# Patient Record
Sex: Female | Born: 1935 | Race: White | Hispanic: No | Smoking: Former smoker
Health system: Southern US, Community
[De-identification: ages and names within clinical notes are randomized; demographics above are authoritative.]

## PROBLEM LIST (undated history)

## (undated) DIAGNOSIS — C4492 Squamous cell carcinoma of skin, unspecified: Secondary | ICD-10-CM

## (undated) DIAGNOSIS — G629 Polyneuropathy, unspecified: Secondary | ICD-10-CM

## (undated) DIAGNOSIS — M4800 Spinal stenosis, site unspecified: Secondary | ICD-10-CM

## (undated) DIAGNOSIS — N3281 Overactive bladder: Secondary | ICD-10-CM

## (undated) DIAGNOSIS — M810 Age-related osteoporosis without current pathological fracture: Secondary | ICD-10-CM

## (undated) DIAGNOSIS — E7800 Pure hypercholesterolemia, unspecified: Secondary | ICD-10-CM

## (undated) DIAGNOSIS — I10 Essential (primary) hypertension: Secondary | ICD-10-CM

## (undated) DIAGNOSIS — S329XXA Fracture of unspecified parts of lumbosacral spine and pelvis, initial encounter for closed fracture: Secondary | ICD-10-CM

## (undated) DIAGNOSIS — M1990 Unspecified osteoarthritis, unspecified site: Secondary | ICD-10-CM

## (undated) HISTORY — PX: TONSILLECTOMY: SUR1361

## (undated) HISTORY — DX: Fracture of unspecified parts of lumbosacral spine and pelvis, initial encounter for closed fracture: S32.9XXA

---

## 2001-04-23 ENCOUNTER — Encounter: Payer: Self-pay | Admitting: Internal Medicine

## 2001-04-23 ENCOUNTER — Encounter: Admission: RE | Admit: 2001-04-23 | Discharge: 2001-04-23 | Payer: Self-pay | Admitting: Internal Medicine

## 2002-05-07 ENCOUNTER — Encounter: Admission: RE | Admit: 2002-05-07 | Discharge: 2002-05-07 | Payer: Self-pay | Admitting: Internal Medicine

## 2002-05-07 ENCOUNTER — Encounter: Payer: Self-pay | Admitting: Internal Medicine

## 2003-05-11 ENCOUNTER — Ambulatory Visit (HOSPITAL_COMMUNITY): Admission: RE | Admit: 2003-05-11 | Discharge: 2003-05-11 | Payer: Self-pay | Admitting: Internal Medicine

## 2004-05-11 ENCOUNTER — Ambulatory Visit (HOSPITAL_COMMUNITY): Admission: RE | Admit: 2004-05-11 | Discharge: 2004-05-11 | Payer: Self-pay | Admitting: Internal Medicine

## 2005-05-16 ENCOUNTER — Ambulatory Visit (HOSPITAL_COMMUNITY): Admission: RE | Admit: 2005-05-16 | Discharge: 2005-05-16 | Payer: Self-pay | Admitting: Internal Medicine

## 2006-05-23 ENCOUNTER — Ambulatory Visit (HOSPITAL_COMMUNITY): Admission: RE | Admit: 2006-05-23 | Discharge: 2006-05-23 | Payer: Self-pay | Admitting: Internal Medicine

## 2007-05-26 ENCOUNTER — Ambulatory Visit (HOSPITAL_COMMUNITY): Admission: RE | Admit: 2007-05-26 | Discharge: 2007-05-26 | Payer: Self-pay | Admitting: Internal Medicine

## 2007-07-19 ENCOUNTER — Inpatient Hospital Stay (HOSPITAL_COMMUNITY): Admission: EM | Admit: 2007-07-19 | Discharge: 2007-07-25 | Payer: Self-pay | Admitting: Emergency Medicine

## 2007-09-01 ENCOUNTER — Encounter: Admission: RE | Admit: 2007-09-01 | Discharge: 2007-10-03 | Payer: Self-pay | Admitting: Internal Medicine

## 2008-03-29 ENCOUNTER — Encounter: Admission: RE | Admit: 2008-03-29 | Discharge: 2008-03-29 | Payer: Self-pay | Admitting: Internal Medicine

## 2008-05-27 ENCOUNTER — Ambulatory Visit (HOSPITAL_COMMUNITY): Admission: RE | Admit: 2008-05-27 | Discharge: 2008-05-27 | Payer: Self-pay | Admitting: Internal Medicine

## 2009-05-31 ENCOUNTER — Ambulatory Visit (HOSPITAL_COMMUNITY): Admission: RE | Admit: 2009-05-31 | Discharge: 2009-05-31 | Payer: Self-pay | Admitting: Internal Medicine

## 2010-05-24 ENCOUNTER — Other Ambulatory Visit: Payer: Self-pay | Admitting: Internal Medicine

## 2010-05-30 ENCOUNTER — Ambulatory Visit
Admission: RE | Admit: 2010-05-30 | Discharge: 2010-05-30 | Disposition: A | Discharge status: Home / Self Care | Payer: Medicare Other | Source: Ambulatory Visit | Attending: Internal Medicine | Admitting: Internal Medicine

## 2010-06-20 NOTE — Discharge Summary (Signed)
NAMEKIMORAH, Ashlee [name]              ACCOUNT NO.:  [id number]   MEDICAL RECORD NO.:  [id number]          PATIENT TYPE:  INP   LOCATION:  1508                         FACILITY:  [facility]   PHYSICIAN:  Ashlee Pounds, MD       DATE OF BIRTH:  1914/03/14   DATE OF ADMISSION:  07/19/2007  DATE OF DISCHARGE:  07/24/2007                               DISCHARGE SUMMARY   PRIMARY CARE Ashlee [name]:  Dr. Waynard [name].   DISCHARGE DIAGNOSES:  1. Falls.  2. Deconditioning.  3. Failure to thrive.  4. Iron deficiency anemia.  5. E-coli urinary tract infection.  6. Hypertension.  7. Bronchitis with asthmatic component and hypoxia, slowly resolving.  8. History of tonsil and adenoidectomy.  9. History of osteoarthritis.  10.Known peripheral neuropathy.  11.Osteoporosis.  12.History of shingles to the left face, June 2002.  13.Mild hypertriglyceridemia.  14.History of carpal tunnel syndrome.  15.Severe lumbar spinal stenosis.  16.Hard of hearing.  17.Mild impaired fasting glucose.  18.Aortic regurgitation.  19.Mild renal insufficiency.  20.Peripheral arterial disease with ankle brachial indexes of 0.8 and      mild to moderate plaque in the bilateral carotids.  21.Basal cell skin cancer.   ALLERGIES:  1. PENICILLIN CAUSES SWELLING.  2. NEURONTIN, FATIGUE.  3. FOSAMAX, REFLUX.   MEDICATION LIST:  1. Oxygen 2 liters nasal cannula, slowly wean as she is able based on      room air sats while ambulating.  Incentive spirometry q.1 h while      awake.  2. Flutter valve each shift.  3. Norvasc 5 mg p.o. daily.  4. Tenormin 50 mg p.o. daily, okay to hold if the patient is wheezing.      Hold if systolic blood pressure is less than 110.  5. Aspirin 81 daily.  6. Lidoderm 5% patch on at 8:00 a.m. off at 8:00 p.m. to lower back,      one patch.  7. Iron sulfate 325 mg q.a.m.  8. Levaquin 500 mg through July 26, 2007, 1 tablet per day do not      substitute.  9. Tessalon pearls 200 mg p.o. q.8 h through  June 20.  10.Aranesp as needed.  11.Tylenol 650 mg p.o. t.i.d. p.r.n.  12.Ventolin nebulizer or HFA q.4 h p.r.n. wheezing.  13.Senokot-S 1 tablet q.h.s. p.r.n., hold for diarrhea.  14.Benazepril HCT 28/12.5 mg, one per day.  15.Simvastatin 40 mg one-half daily.  16.Multivitamin one p.o. daily.  17.Vitamin D 400 international units twice a day.  18.TUMS 750 mg p.o. b.i.d.  19.Aldara 5% cream as directed by her dermatologist.  20.Boniva 150 mg monthly.  21.Betasal as directed.   DISCHARGE PROCEDURES:  1. Two units packed red blood cells on July 23, 2007.  2. Aranesp injection on July 22, 2007.  3. PT, OT, case management consultations.  4. Aggressive pulmonary toilet with respiratory therapy.  5. Chest x-ray on July 22, 2007, no active disease.  6. MRI of the T-spine on June 15, showing no significant abnormality      of thoracic spine.  No fractures or other acute abnormalities.  There is a slight cord compression at C6-C7 due to bulging of the      disk and posterior spurring as well as spondylolisthesis of C6-C7.  7. MRI of the lumbar spine done on June 15 shows no acute      abnormalities in the lumbar spine.  The L1 finding that I thought      was a compression fracture was an old compression fracture,      actually was an old Schmorl noted in the superior endplate of L1      that simulates a compression fracture and certainly not acute.  The      severe spinal stenosis at L4-L5 due to severe facet joint      arthropathy and spondylolisthesis and severe facet joint arthritis      at L1 bilaterally.  8. Chest x-ray done June 15 showed no acute finding.  9. Lumbar spine films done on June 13 showed an L1 superior endplate      depression of an indeterminate age.  MRI can be performed as      clinically indicated.  An x-ray done on June 13 showed left basilar      atelectasis or scar, cardiomegaly, and no congestive heart failure      noted.   HISTORY OF PRESENT ILLNESS:   Briefly, [name] is a very  pleasant 77 year old female who lives on her own and has some support  from nieces and nephews.  She presented to medical attention on July 19, 2007 after falling in her bedroom on the way to the bathroom.  There was  no preceding event.  When she was on the floor, she was unable to bring  herself back to a standing admission, feeling her body just did not  work.  She called over the phone, called a neighbor, and 911 was called  and she was brought to the emergency department.   She complained of mild mid to lower back pain that did not radiate down  her leg.  She did report some dysuria and frequency, and she was  recently started on some Vesicare.  In the emergency department, her O2  sats were 91% on room air, blood pressure was fine.  She was afebrile.  Lungs were clear.  She did have a slight murmur and she was moving all  four extremities well.   Her white count was 16.3, hemoglobin 11.2, CK and MB were negative.  Troponin was negative.  Urinalysis showed urinary tract infection.  Labs  were unremarkable.  AST was 55 and glucose was a little high at 148.  Chest x-ray was done which showed left basilar atelectasis and  cardiomegaly without pulmonary edema.  Lumbar x-ray shows L1 superior  end plate depression, needing further studies.  Dr. Eloise [name] from my  office was called, and she was admitted for a fall, failure to thrive,  deconditioning, cystitis and back pain.   HOSPITAL COURSE:  [name] was admitted through the emergency  department after a fall at her house that Dr. Eloise [name] felt was  secondary to hypotension with decreased food and water intake aggravated  by her chronic medical conditions.  He felt that she may have been  orthostatic.  He backed down on her atenolol, took her off her ACE  inhibitor hydrochlorothiazide combination pill, put her on telemetry and  got physical and occupational therapy, at the same time  starting her on  antibiotics.  He also ordered other  films to rule in or rule out a  compression fracture.  She also describes some nausea and upset stomach  and some anorexia.   The following morning, her potassium was 2.6 and this was obviously  repleted.  I started seeing her on July 21, 2007.  I might note the day  that she was admitted two days ago with fall, back pain, urinary tract  infection.  Wonder if she had early pyelo.  Nausea was already better.  There is no other complaints.  She did not have any bowel movements, and  she was having some shortness of breath and wheezing from the night  before, although by the time I had seen her, she had and a nebulizer  treatment and was already better.  Physically, she had a black eye that  had happened beforehand.  She was moving all fours.  She is on oxygen.  Her white count was already improving from 16.3 down to 13.2.  Her  hemoglobin went down into the 9s.  The rest her labs were unremarkable.  The urine culture showed E-coli.  It came back pansensitive.  She was  continued on her quinolone.  Her CK went up to 1209, troponin went up to  0.07, but these were felt related to the fall and muscle injury.  Chest  x-ray again showed no acute disease.   Her nausea was better with the treatment of the urinary tract infection.  Her anorexia and poor appetite was also improving.  Her hypokalemia had  already improved.  Her back pain was already improved.  I added some  Lidoderm, and we did get an MRI.  The MRI did not show any acute  compression fracture, and she does not need any further workup for this.  Medications were adjusted.  For her cough, shortness of breath,  wheezing, chest x-rays being negative, eventually prednisone was added  for a couple of days.  Aggressive pulmonary toilet was done.  Oxygen was  continued because her oxygen sats dropped into the 80s while walking,  and she did develop a cough.  This was felt to be potential  asthmatic  bronchitis, and the Cipro was changed over to Levaquin, dispense as  written, as well as pulmonary toilet.  She appears to be slowly  improving with this.   Falls, failure to thrive, PT, OT, case management were all involved.  She walks with a walker, and she is needing the oxygen.  It was felt  that based on her age and physical conditioning, she would probably be  best served if she went to a skilled nursing facility for rehab for a  couple of weeks prior to returning home.  This was arranged, and on July 24, 2007, she is in stable condition, and she will be discharged there  today.   The elevated CK level was presumed secondary to fall.  There was no  evidence of an MI noted.   For constipation, she had aggressive bowel regimen, and she eventually  did go, and she is doing fine.   Her blood pressure remained fine throughout the hospital stay.  It  has  been as low as 104 and as high as 160.  We will start the ACE HCT  combination back slowly and watch the blood pressures to see if she gets  orthostatic or symptoms.  It is probably smart to keep her on the lowest  doses that she needs.   She is to remain on  DVT prophylaxis.   Her leukocytosis improved and then got worse after couple doses of  steroids, but this is not considered to be anything significant.   She did have some iron deficiency anemia.  She did get a dose of  Aranesp, and then on the next day her hemoglobin actually was lower.  It  was down as low as 8.8.  It was felt that based on the hypoxia and  weakness, she probably would do better if she had some blood.  Two units  of blood were given, and her hemoglobin on the date of discharge was  12.7.  All the rest of her labs were fine.  Her white count is 16.4 and  again, she did get a couple doses of steroids.  She was 91% and 93% on  room air today after she had walked to the bathroom to take care of her  hair.  She will finish the antibiotics in a  couple days for the E-coli  urinary tract infection.  She will go to rehab today.  For the  bronchitis with asthmatic component and mild hypoxia, continue pulmonary  toilet.  May need to come off the beta blocker completely.  Post  transfusion, her anemia is doing much better.   Her short list today for which rehab facility she goes to includes  [facility], includes [facility], and includes [facility].  Her family has a list of other facilities that they are  going to see in the morning and make a decision somewhere around noon  and then she will be okay to go.      Ashlee Pounds, MD  Electronically Signed     JMR/MEDQ  D:  07/24/2007  T:  07/24/2007  Job:  [id number]   cc:   Loraine Leriche A. Perini, M.D.  Fax: [phone]   Barry Dienes. Ashlee [name], M.D.  Fax: [phone]

## 2010-06-20 NOTE — H&P (Signed)
Ashlee [name], Ashlee [name]              ACCOUNT NO.:  [id number]   MEDICAL RECORD NO.:  [id number]          PATIENT TYPE:  EMS   LOCATION:  ED                           FACILITY[facility]   PHYSICIAN:  Barry Dienes. Eloise Harman, M.D.DATE OF BIRTH:  1914/06/01   DATE OF ADMISSION:  07/19/2007  DATE OF DISCHARGE:                              HISTORY & PHYSICAL   CHIEF COMPLAINT:  Status post fall.   HISTORY OF PRESENT ILLNESS:  The patient is a 77 year old Caucasian  woman who is status post a fall this morning.  The fall occurred when  she was going to the bathroom.  She did not have preceding palpitations  or chest discomfort, and did not trip on anything just prior to the  fall.  When she got to the floor, she was unable to bring herself back  to standing position, feeling that her body just did not work.  She  crawled to the phone and called her neighbor who arrived and called 9-1-  1.  Since the fall, she has had mild mid to lower back pain not  radiating down her legs.  She also described a one week of dysuria and  frequency, for which she started Vesicare yesterday.   PAST MEDICAL HISTORY:  1. Hypertension.  2. Osteoarthritis.  3. Decreased hearing bilaterally.   MEDICATIONS PRIOR TO ADMISSION:  1. Benazepril/HCTZ 20/12.5 every a.m.  2. Phenergan 12.5 mg p.o. q.8 h p.r.n. nausea.  3. Amlodipine 5 mg p.o. daily.  4. Ecotrin 81 mg p.o. daily.  5. Atenolol 100 mg p.o. daily.   ALLERGIES:  NO KNOWN DRUG ALLERGIES.   PAST SURGICAL HISTORY:  None.   FAMILY HISTORY:  Notable for a brother who has had diabetes mellitus,  type 2, but no close relatives with premature coronary artery disease,  colon cancer, or breast cancer.   SOCIAL HISTORY:  She has been a widow since 5.  She has no children.  She has no history of tobacco or alcohol abuse.  She does have a person  with a medical power of attorney for her who is a niece who lives in  [place].   REVIEW OF SYSTEMS:  Also notable for  mild low back pain since her fall.  She denies recent fever, chills or anxiety or depression.  She had some  presyncope symptoms recently.   PHYSICAL EXAMINATION:  VITAL SIGNS:  Blood pressure 105/51, pulse 74,  respirations 20, temperature 97.6, pulse oxygen saturation 91% on room  air.  GENERAL:  She is an elderly white female who is in no apparent distress  while lying nearly fully supine.  HEENT:  Exam was within normal limits.  NECK:  Supple without jugular venous distention or carotid bruit.  CHEST:  Clear to auscultation.  HEART:  Regular rate and rhythm.  S1-S2 were present with a systolic  ejection murmur grade 2/6 at the left sternal border.  ABDOMEN:  Normal bowel sounds and no hepatosplenomegaly or tenderness.  EXTREMITIES:  No significant edema.  There were nonpalpable pedal pulses  bilaterally.  Capillary refill occurs in less than 1 second.  NEUROLOGICAL:  She is alert and answered questions well.  At times, she  had mild word-finding difficulty.  She is able to move all four  extremities well.   INITIAL LABORATORY STUDIES:  White blood cells 16.3, hemoglobin 11.2,  hematocrit 32.6, platelets 27, CK-MB 19.1.  Troponin I level was less  than 0.05.  Urinalysis had specific gravity 1.025, was nitrite negative,  11-20 white blood cells, and rare bacteria.  Serum sodium 137, potassium  3.4, chloride 100, carbon dioxide 27, BUN 45, creatinine 1.36, glucose  148, SGOT 55.   Chest x-ray showed the following:  1. Left basilar atelectasis.  2. Cardiomegaly without pulmonary edema.   Lumbar spine x-rays showed the following:  L1 superior end plate  depression of unclear age.   IMPRESSION AND PLAN:  1. Set status post fall:  This is most likely secondary to hypotension      secondary to decreased food and fluid intake that is aggravated by      her chronic medications.  I plan to decrease atenolol to 50 mg once      daily.  We will also administer some IV fluids.  She will  be kept      on telemetry to rule out a significant arrhythmia.  A physical      therapist will evaluate her gait.  2. Cystitis:  Mild symptoms are present which will be treated with      Levaquin 500 mg p.o. daily.  3. Back pain:  Likely secondary to a contusion from a fall versus her      known compression fracture at L1.  I plan to have physical therapy      evaluate her gait.  If she shows significant increased back pain      with standing and weightbearing, then a MRI scan of the thoracic      and lumbar spine will be obtained.           ______________________________  Barry Dienes. Eloise Harman, M.D.     DGP/MEDQ  D:  07/19/2007  T:  07/19/2007  Job:  [id number]   cc:   Loraine Leriche A. Perini, M.D.  Fax: [phone]

## 2010-11-02 LAB — BASIC METABOLIC PANEL
BUN: 44 — ABNORMAL HIGH
BUN: 28 — ABNORMAL HIGH
BUN: 44 — ABNORMAL HIGH
CO2: 22
CO2: 28
Calcium: 7.7 — ABNORMAL LOW
Chloride: 104
Chloride: 108
Chloride: 111
Creatinine, Ser: 1.18
GFR calc Af Amer: >60
GFR calc non Af Amer: >60
Glucose, Bld: 137 — ABNORMAL HIGH
Glucose, Bld: 123 — ABNORMAL HIGH
Glucose, Bld: 128 — ABNORMAL HIGH
Potassium: 3.5
Potassium: 3.6
Potassium: 4
Sodium: 136
Sodium: 140

## 2010-11-02 LAB — URINALYSIS, ROUTINE W REFLEX MICROSCOPIC
Nitrite: NEGATIVE
Specific Gravity, Urine: 1.025
Urobilinogen, UA: 0.2
pH: 6

## 2010-11-02 LAB — CBC
HCT: 26 — ABNORMAL LOW
HCT: 27.8 — ABNORMAL LOW
HCT: 36.9
Hemoglobin: 11.2 — ABNORMAL LOW
Hemoglobin: 8.8 — ABNORMAL LOW
MCHC: 34.3
MCHC: 34.5
MCV: 89
MCV: 87.1
MCV: 88.1
MCV: 88.4
MCV: 89.5
Platelets: 143 — ABNORMAL LOW
Platelets: 188
RBC: 3.18 — ABNORMAL LOW
RBC: 3.67 — ABNORMAL LOW
RBC: 3.35 — ABNORMAL LOW
RBC: 4.23
RDW: 13.5
RDW: 13.7
RDW: 14
RDW: 14.2
WBC: 11.5 — ABNORMAL HIGH
WBC: 13.2 — ABNORMAL HIGH
WBC: 16.4 — ABNORMAL HIGH

## 2010-11-02 LAB — COMPREHENSIVE METABOLIC PANEL
ALT: 29
Albumin: 3.4 — ABNORMAL LOW
Alkaline Phosphatase: 47
BUN: 45 — ABNORMAL HIGH
Chloride: 100
Potassium: 3.4 — ABNORMAL LOW
Sodium: 137
Total Bilirubin: 0.9
Total Protein: 6.8

## 2010-11-02 LAB — DIFFERENTIAL
Basophils Relative: 0
Eosinophils Absolute: 0
Eosinophils Relative: 0
Lymphs Abs: 0.7
Monocytes Absolute: 1.3 — ABNORMAL HIGH
Monocytes Relative: 8

## 2010-11-02 LAB — CROSSMATCH

## 2010-11-02 LAB — POCT CARDIAC MARKERS
CKMB, poc: 14.5
Myoglobin, poc: >500
Myoglobin, poc: >500
Operator id: 294591
Operator id: 294591
Troponin i, poc: <0.05

## 2010-11-02 LAB — VITAMIN B12
Vitamin B-12: 382 (ref 211–911)
Vitamin B-12: 899 (ref 211–911)

## 2010-11-02 LAB — IRON AND TIBC
Iron: 13 — ABNORMAL LOW
Iron: 48
Saturation Ratios: 22
TIBC: 219 — ABNORMAL LOW
UIBC: 171
UIBC: 176

## 2010-11-02 LAB — URINE CULTURE

## 2010-11-02 LAB — URINE MICROSCOPIC-ADD ON

## 2010-11-02 LAB — TROPONIN I: Troponin I: 0.07 — ABNORMAL HIGH

## 2010-11-02 LAB — CK: Total CK: 1209 — ABNORMAL HIGH

## 2010-11-02 LAB — RETICULOCYTES
RBC.: 3.25 — ABNORMAL LOW
RBC.: 3.45 — ABNORMAL LOW
Retic Count, Absolute: 19.5
Retic Ct Pct: 0.6

## 2010-11-02 LAB — ABO/RH: ABO/RH(D): O NEG

## 2010-11-02 LAB — FERRITIN: Ferritin: 706 — ABNORMAL HIGH (ref 10–291)

## 2011-04-29 ENCOUNTER — Encounter (HOSPITAL_COMMUNITY): Payer: Self-pay

## 2011-04-29 ENCOUNTER — Emergency Department (HOSPITAL_COMMUNITY)
Admission: EM | Admit: 2011-04-29 | Discharge: 2011-04-29 | Disposition: A | Discharge status: Home / Self Care | Payer: Medicare Other | Attending: Emergency Medicine | Admitting: Emergency Medicine

## 2011-04-29 ENCOUNTER — Emergency Department (HOSPITAL_COMMUNITY): Payer: Medicare Other

## 2011-04-29 DIAGNOSIS — S0180XA Unspecified open wound of other part of head, initial encounter: Secondary | ICD-10-CM | POA: Insufficient documentation

## 2011-04-29 DIAGNOSIS — Y92009 Unspecified place in unspecified non-institutional (private) residence as the place of occurrence of the external cause: Secondary | ICD-10-CM | POA: Insufficient documentation

## 2011-04-29 DIAGNOSIS — E7800 Pure hypercholesterolemia, unspecified: Secondary | ICD-10-CM | POA: Insufficient documentation

## 2011-04-29 DIAGNOSIS — S0181XA Laceration without foreign body of other part of head, initial encounter: Secondary | ICD-10-CM

## 2011-04-29 DIAGNOSIS — S0093XA Contusion of unspecified part of head, initial encounter: Secondary | ICD-10-CM

## 2011-04-29 DIAGNOSIS — I10 Essential (primary) hypertension: Secondary | ICD-10-CM | POA: Insufficient documentation

## 2011-04-29 DIAGNOSIS — S0083XA Contusion of other part of head, initial encounter: Secondary | ICD-10-CM | POA: Insufficient documentation

## 2011-04-29 DIAGNOSIS — S0003XA Contusion of scalp, initial encounter: Secondary | ICD-10-CM | POA: Insufficient documentation

## 2011-04-29 DIAGNOSIS — W010XXA Fall on same level from slipping, tripping and stumbling without subsequent striking against object, initial encounter: Secondary | ICD-10-CM | POA: Insufficient documentation

## 2011-04-29 HISTORY — DX: Pure hypercholesterolemia, unspecified: E78.00

## 2011-04-29 HISTORY — DX: Essential (primary) hypertension: I10

## 2011-04-29 HISTORY — DX: Spinal stenosis, site unspecified: M48.00

## 2011-04-29 MED ORDER — LIDOCAINE-EPINEPHRINE (PF) 1 %-1:200000 IJ SOLN
INTRAMUSCULAR | Status: AC
  Administered 2011-04-29: 21:00:00
  Filled 2011-04-29: qty 10

## 2011-04-29 NOTE — ED Provider Notes (Signed)
History     CSN: [id number]  Arrival date & time 04/29/11  1757   First MD Initiated Contact with Patient 04/29/11 1817      Chief Complaint  Patient presents with  . Fall    (Consider location/radiation/quality/duration/timing/severity/associated sxs/prior treatment) HPI  Past Medical History  Diagnosis Date  . Hypertension   . High cholesterol   . Spinal stenosis     History reviewed. No pertinent past surgical history.  History reviewed. No pertinent family history.  History  Substance Use Topics  . Smoking status: Never Smoker   . Smokeless tobacco: Not on file  . Alcohol Use: No    OB History    Grav Para Term Preterm Abortions TAB SAB Ect Mult Living                  Review of Systems  Allergies  Penicillins  Home Medications   Current Outpatient Rx  Name Route Sig Dispense Refill  . AMLODIPINE BESYLATE 5 MG PO TABS Oral Take 5 mg by mouth daily.    . ASPIRIN 81 MG PO CHEW Oral Chew 81 mg by mouth daily.    . ATENOLOL 100 MG PO TABS Oral Take 100 mg by mouth daily.    Marland Kitchen BENAZEPRIL-HYDROCHLOROTHIAZIDE 20-12.5 MG PO TABS Oral Take 1 tablet by mouth daily.    Marland Kitchen SIMVASTATIN 40 MG PO TABS Oral Take 20 mg by mouth every evening.      BP 137/74  Pulse 72  Temp(Src) 97.9 F (36.6 C) (Oral)  Resp 16  SpO2 97%  Physical Exam  ED Course  LACERATION REPAIR Date/Time: 04/29/2011 8:34 PM Performed by: Arman Filter Authorized by: Arman Filter Consent: Verbal consent obtained. Consent given by: patient Patient identity confirmed: verbally with patient Body area: head/neck Location details: forehead Laceration length: 1 cm Foreign bodies: unknown Tendon involvement: none Nerve involvement: none Vascular damage: no Anesthesia: local infiltration Local anesthetic: lidocaine 1% without epinephrine Anesthetic total: 1.5 ml Patient sedated: no Irrigation solution: saline Amount of cleaning: standard Debridement: none Degree of undermining:  none Skin closure: 6-0 Prolene Number of sutures: 4 Technique: simple Approximation: close Approximation difficulty: simple Dressing: antibiotic ointment Patient tolerance: Patient tolerated the procedure well with no immediate complications.   (including critical care time)  Labs Reviewed - No data to display Ct Head Wo Contrast  04/29/2011  *RADIOLOGY REPORT*  Clinical Data:  Fall.  CT HEAD WITHOUT CONTRAST CT MAXILLOFACIAL WITHOUT CONTRAST  Technique:  Multidetector CT imaging of the head and maxillofacial structures were performed using the standard protocol without intravenous contrast. Multiplanar CT image reconstructions of the maxillofacial structures were also generated.  Comparison:   None.  CT HEAD  Findings: Left frontal moderate to large sized scalp hematoma without underlying fracture.  Left posterior superior scalp laceration.  No intracranial hemorrhage.  Prominent small vessel disease type changes. No CT evidence of large acute infarct. No intracranial mass lesion detected on this unenhanced exam.  Global atrophy.  Ventricular prominence felt to be related to atrophy rather hydrocephalus.  IMPRESSION: Scalp hematoma/laceration without underlying fracture or intracranial hemorrhage.  Please see above.  CT MAXILLOFACIAL  Findings:   No facial or orbital fracture.  IMPRESSION: No facial or orbital fracture.  Original Report Authenticated By: Fuller Canada, M.D.   Ct Maxillofacial Wo Cm  04/29/2011  *RADIOLOGY REPORT*  Clinical Data:  Fall.  CT HEAD WITHOUT CONTRAST CT MAXILLOFACIAL WITHOUT CONTRAST  Technique:  Multidetector CT imaging of the head  and maxillofacial structures were performed using the standard protocol without intravenous contrast. Multiplanar CT image reconstructions of the maxillofacial structures were also generated.  Comparison:   None.  CT HEAD  Findings: Left frontal moderate to large sized scalp hematoma without underlying fracture.  Left posterior superior  scalp laceration.  No intracranial hemorrhage.  Prominent small vessel disease type changes. No CT evidence of large acute infarct. No intracranial mass lesion detected on this unenhanced exam.  Global atrophy.  Ventricular prominence felt to be related to atrophy rather hydrocephalus.  IMPRESSION: Scalp hematoma/laceration without underlying fracture or intracranial hemorrhage.  Please see above.  CT MAXILLOFACIAL  Findings:   No facial or orbital fracture.  IMPRESSION: No facial or orbital fracture.  Original Report Authenticated By: Fuller Canada, M.D.     1. Head contusion   2. Forehead laceration       MDM  Laceration to forhead        Arman Filter, NP 04/29/11 2035

## 2011-04-29 NOTE — ED Notes (Signed)
[id number]<JY> Expected date:<BR> Expected time: 5:52 PM<BR> Means of arrival:<BR> Comments:<BR> M43 - 89yoF Slipped, fall, hematoma above eye

## 2011-04-29 NOTE — ED Provider Notes (Signed)
Medical screening examination/treatment/procedure(s) were conducted as a shared visit with non-physician practitioner(s) and myself.  I personally evaluated the patient during the encounter  Toy Baker, MD 04/29/11 2328

## 2011-04-29 NOTE — ED Notes (Signed)
Per ems, pt from home, went outdoor and slipped on the brick floor, hematoma on left forehead, bleeding controlled, ems reports laceration above left eyebrow. No LOC. Denied taking coumadin pt DNR

## 2011-04-29 NOTE — Discharge Instructions (Signed)
Have sutures removed in 5 days  Contusion A contusion is a deep bruise. Contusions are the result of an injury that caused bleeding under the skin. The contusion may turn blue, purple, or yellow. Minor injuries will give you a painless contusion, but more severe contusions may stay painful and swollen for a few weeks.  CAUSES  A contusion is usually caused by a blow, trauma, or direct force to an area of the body. SYMPTOMS   Swelling and redness of the injured area.   Bruising of the injured area.   Tenderness and soreness of the injured area.   Pain.  DIAGNOSIS  The diagnosis can be made by taking a history and physical exam. An X-ray, CT scan, or MRI may be needed to determine if there were any associated injuries, such as fractures. TREATMENT  Specific treatment will depend on what area of the body was injured. In general, the best treatment for a contusion is resting, icing, elevating, and applying cold compresses to the injured area. Over-the-counter medicines may also be recommended for pain control. Ask your caregiver what the best treatment is for your contusion. HOME CARE INSTRUCTIONS   Put ice on the injured area.   Put ice in a plastic bag.   Place a towel between your skin and the bag.   Leave the ice on for 15 to 20 minutes, 3 to 4 times a day.   Only take over-the-counter or prescription medicines for pain, discomfort, or fever as directed by your caregiver. Your caregiver may recommend avoiding anti-inflammatory medicines (aspirin, ibuprofen, and naproxen) for 48 hours because these medicines may increase bruising.   Rest the injured area.   If possible, elevate the injured area to reduce swelling.  SEEK IMMEDIATE MEDICAL CARE IF:   You have increased bruising or swelling.   You have pain that is getting worse.   Your swelling or pain is not relieved with medicines.  MAKE SURE YOU:   Understand these instructions.   Will watch your condition.   Will get  help right away if you are not doing well or get worse.  Document Released: 11/01/2004 Document Revised: 01/11/2011 Document Reviewed: 11/27/2010 [facility] Patient Information 2012 [place], [place].Head Injury, Adult You have had a head injury that does not appear serious at this time. A concussion is a state of changed mental ability, usually from a blow to the head. You should take clear liquids for the rest of the day and then resume your regular diet. You should not take sedatives or alcoholic beverages for as long as directed by your caregiver after discharge. After injuries such as yours, most problems occur within the first 24 hours. SYMPTOMS These minor symptoms may be experienced after discharge:  Memory difficulties.   Dizziness.   Headaches.   Double vision.   Hearing difficulties.   Depression.   Tiredness.   Weakness.   Difficulty with concentration.  If you experience any of these problems, you should not be alarmed. A concussion requires a few days for recovery. Many patients with head injuries frequently experience such symptoms. Usually, these problems disappear without medical care. If symptoms last for more than one day, notify your caregiver. See your caregiver sooner if symptoms are becoming worse rather than better. HOME CARE INSTRUCTIONS   During the next 24 hours you must stay with someone who can watch you for the warning signs listed below.  Although it is unlikely that serious side effects will occur, you should be aware of  signs and symptoms which may necessitate your return to this location. Side effects may occur up to 7 - 10 days following the injury. It is important for you to carefully monitor your condition and contact your caregiver or seek immediate medical attention if there is a change in your condition. SEEK IMMEDIATE MEDICAL CARE IF:   There is confusion or drowsiness.   You can not awaken the injured person.   There is nausea (feeling sick to  your stomach) or continued, forceful vomiting.   You notice dizziness or unsteadiness which is getting worse, or inability to walk.   You have convulsions or unconsciousness.   You experience severe, persistent headaches not relieved by over-the-counter or prescription medicines for pain. (Do not take aspirin as this impairs clotting abilities). Take other pain medications only as directed.   You can not use arms or legs normally.   There is clear or bloody discharge from the nose or ears.  MAKE SURE YOU:   Understand these instructions.   Will watch your condition.   Will get help right away if you are not doing well or get worse.  Document Released: 01/22/2005 Document Revised: 01/11/2011 Document Reviewed: 12/10/2008 [employer] Patient Information 2012 [employer], LLLaceration Care, Adult A laceration is a cut or lesion that goes through all layers of the skin and into the tissue just beneath the skin. TREATMENT  Some lacerations may not require closure. Some lacerations may not be able to be closed due to an increased risk of infection. It is important to see your caregiver as soon as possible after an injury to minimize the risk of infection and maximize the opportunity for successful closure. If closure is appropriate, pain medicines may be given, if needed. The wound will be cleaned to help prevent infection. Your caregiver will use stitches (sutures), staples, wound glue (adhesive), or skin adhesive strips to repair the laceration. These tools bring the skin edges together to allow for faster healing and a better cosmetic outcome. However, all wounds will heal with a scar. Once the wound has healed, scarring can be minimized by covering the wound with sunscreen during the day for 1 full year. HOME CARE INSTRUCTIONS  For sutures or staples:  Keep the wound clean and dry.   If you were given a bandage (dressing), you should change it at least once a day. Also, change the dressing if  it becomes wet or dirty, or as directed by your caregiver.   Wash the wound with soap and water 2 times a day. Rinse the wound off with water to remove all soap. Pat the wound dry with a clean towel.   After cleaning, apply a thin layer of the antibiotic ointment as recommended by your caregiver. This will help prevent infection and keep the dressing from sticking.   You may shower as usual after the first 24 hours. Do not soak the wound in water until the sutures are removed.   Only take over-the-counter or prescription medicines for pain, discomfort, or fever as directed by your caregiver.   Get your sutures or staples removed as directed by your caregiver.  For skin adhesive strips:  Keep the wound clean and dry.   Do not get the skin adhesive strips wet. You may bathe carefully, using caution to keep the wound dry.   If the wound gets wet, pat it dry with a clean towel.   Skin adhesive strips will fall off on their own. You may trim the strips as  the wound heals. Do not remove skin adhesive strips that are still stuck to the wound. They will fall off in time.  For wound adhesive:  You may briefly wet your wound in the shower or bath. Do not soak or scrub the wound. Do not swim. Avoid periods of heavy perspiration until the skin adhesive has fallen off on its own. After showering or bathing, gently pat the wound dry with a clean towel.   Do not apply liquid medicine, cream medicine, or ointment medicine to your wound while the skin adhesive is in place. This may loosen the film before your wound is healed.   If a dressing is placed over the wound, be careful not to apply tape directly over the skin adhesive. This may cause the adhesive to be pulled off before the wound is healed.   Avoid prolonged exposure to sunlight or tanning lamps while the skin adhesive is in place. Exposure to ultraviolet light in the first year will darken the scar.   The skin adhesive will usually remain in  place for 5 to 10 days, then naturally fall off the skin. Do not pick at the adhesive film.  You may need a tetanus shot if:  You cannot remember when you had your last tetanus shot.   You have never had a tetanus shot.  If you get a tetanus shot, your arm may swell, get red, and feel warm to the touch. This is common and not a problem. If you need a tetanus shot and you choose not to have one, there is a rare chance of getting tetanus. Sickness from tetanus can be serious. SEEK MEDICAL CARE IF:   You have redness, swelling, or increasing pain in the wound.   You see a red line that goes away from the wound.   You have yellowish-white fluid (pus) coming from the wound.   You have a fever.   You notice a bad smell coming from the wound or dressing.   Your wound breaks open before or after sutures have been removed.   You notice something coming out of the wound such as wood or glass.   Your wound is on your hand or foot and you cannot move a finger or toe.  SEEK IMMEDIATE MEDICAL CARE IF:   Your pain is not controlled with prescribed medicine.   You have severe swelling around the wound causing pain and numbness or a change in color in your arm, hand, leg, or foot.   Your wound splits open and starts bleeding.   You have worsening numbness, weakness, or loss of function of any joint around or beyond the wound.   You develop painful lumps near the wound or on the skin anywhere on your body.  MAKE SURE YOU:   Understand these instructions.   Will watch your condition.   Will get help right away if you are not doing well or get worse.  Document Released: 01/22/2005 Document Revised: 01/11/2011 Document Reviewed: 07/18/2010 [facility] [place] Patient Information 2012 [place], [place].C.

## 2011-04-29 NOTE — ED Notes (Signed)
Suture cart at pt bedside. ED NP Manus Rudd notified.

## 2011-04-29 NOTE — ED Provider Notes (Addendum)
History     CSN: [id number]  Arrival date & time 04/29/11  1757   First MD Initiated Contact with Patient 04/29/11 1817      Chief Complaint  Patient presents with  . Fall    (Consider location/radiation/quality/duration/timing/severity/associated sxs/prior treatment) The history is provided by the patient.   Patient here after slipping and falling at home while using her cane. Patient fell face forward striking her left forehead. No loss of consciousness. Denies any confusion or headache from this. No blurred vision. Denies any hip pain chest pain or abdominal pain. No recent illnesses. Past Medical History  Diagnosis Date  . Hypertension   . High cholesterol   . Spinal stenosis     No past surgical history on file.  No family history on file.  History  Substance Use Topics  . Smoking status: Never Smoker   . Smokeless tobacco: Not on file  . Alcohol Use: No    OB History    Grav Para Term Preterm Abortions TAB SAB Ect Mult Living                  Review of Systems  All other systems reviewed and are negative.    Allergies  Penicillins  Home Medications  No current outpatient prescriptions on file.  There were no vitals taken for this visit.  Physical Exam  Nursing note and vitals reviewed. Constitutional: She is oriented to person, place, and time. She appears well-developed and well-nourished.  Non-toxic appearance. No distress.  HENT:  Head: Normocephalic. Head is with abrasion, with contusion and with laceration.         Large for hematoma noted  Eyes: Conjunctivae, EOM and lids are normal. Pupils are equal, round, and reactive to light.  Neck: Normal range of motion. Neck supple. No tracheal deviation present. No mass present.  Cardiovascular: Normal rate, regular rhythm and normal heart sounds.  Exam reveals no gallop.   No murmur heard. Pulmonary/Chest: Effort normal and breath sounds normal. No stridor. No respiratory distress. She has no  decreased breath sounds. She has no wheezes. She has no rhonchi. She has no rales.  Abdominal: Soft. Normal appearance and bowel sounds are normal. She exhibits no distension. There is no tenderness. There is no rebound and no CVA tenderness.  Musculoskeletal: Normal range of motion. She exhibits no edema and no tenderness.  Neurological: She is alert and oriented to person, place, and time. She has normal strength. No cranial nerve deficit or sensory deficit. GCS eye subscore is 4. GCS verbal subscore is 5. GCS motor subscore is 6.  Skin: Skin is warm and dry. No abrasion and no rash noted.  Psychiatric: She has a normal mood and affect. Her speech is normal and behavior is normal.    ED Course  Procedures (including critical care time)  Labs Reviewed - No data to display No results found.   No diagnosis found.    MDM  Head CT negative.  neurological exam stable. Patient stable for discharge        Toy Baker, MD 04/29/11 1948  Toy Baker, MD 04/29/11 2020

## 2012-10-02 ENCOUNTER — Emergency Department (HOSPITAL_COMMUNITY): Payer: Medicare Other

## 2012-10-02 ENCOUNTER — Observation Stay (HOSPITAL_COMMUNITY)
Admission: EM | Admit: 2012-10-02 | Discharge: 2012-10-03 | Disposition: A | Discharge status: Skilled Nursing Facility | Payer: Medicare Other | Attending: Internal Medicine | Admitting: Internal Medicine

## 2012-10-02 ENCOUNTER — Encounter (HOSPITAL_COMMUNITY): Payer: Self-pay | Admitting: *Deleted

## 2012-10-02 DIAGNOSIS — I10 Essential (primary) hypertension: Secondary | ICD-10-CM | POA: Diagnosis present

## 2012-10-02 DIAGNOSIS — W108XXA Fall (on) (from) other stairs and steps, initial encounter: Secondary | ICD-10-CM | POA: Insufficient documentation

## 2012-10-02 DIAGNOSIS — M4800 Spinal stenosis, site unspecified: Secondary | ICD-10-CM

## 2012-10-02 DIAGNOSIS — W19XXXA Unspecified fall, initial encounter: Secondary | ICD-10-CM

## 2012-10-02 DIAGNOSIS — R911 Solitary pulmonary nodule: Secondary | ICD-10-CM

## 2012-10-02 DIAGNOSIS — S32509A Unspecified fracture of unspecified pubis, initial encounter for closed fracture: Secondary | ICD-10-CM | POA: Insufficient documentation

## 2012-10-02 DIAGNOSIS — G629 Polyneuropathy, unspecified: Secondary | ICD-10-CM

## 2012-10-02 DIAGNOSIS — N318 Other neuromuscular dysfunction of bladder: Secondary | ICD-10-CM | POA: Insufficient documentation

## 2012-10-02 DIAGNOSIS — D72829 Elevated white blood cell count, unspecified: Secondary | ICD-10-CM

## 2012-10-02 DIAGNOSIS — R350 Frequency of micturition: Secondary | ICD-10-CM

## 2012-10-02 DIAGNOSIS — E876 Hypokalemia: Secondary | ICD-10-CM | POA: Diagnosis present

## 2012-10-02 DIAGNOSIS — G589 Mononeuropathy, unspecified: Secondary | ICD-10-CM | POA: Insufficient documentation

## 2012-10-02 DIAGNOSIS — S32591A Other specified fracture of right pubis, initial encounter for closed fracture: Secondary | ICD-10-CM

## 2012-10-02 DIAGNOSIS — S0101XA Laceration without foreign body of scalp, initial encounter: Secondary | ICD-10-CM

## 2012-10-02 DIAGNOSIS — M25559 Pain in unspecified hip: Principal | ICD-10-CM | POA: Insufficient documentation

## 2012-10-02 DIAGNOSIS — E785 Hyperlipidemia, unspecified: Secondary | ICD-10-CM | POA: Diagnosis present

## 2012-10-02 DIAGNOSIS — S3210XA Unspecified fracture of sacrum, initial encounter for closed fracture: Secondary | ICD-10-CM

## 2012-10-02 DIAGNOSIS — S0100XA Unspecified open wound of scalp, initial encounter: Secondary | ICD-10-CM | POA: Insufficient documentation

## 2012-10-02 DIAGNOSIS — M810 Age-related osteoporosis without current pathological fracture: Secondary | ICD-10-CM

## 2012-10-02 DIAGNOSIS — Y92009 Unspecified place in unspecified non-institutional (private) residence as the place of occurrence of the external cause: Secondary | ICD-10-CM

## 2012-10-02 HISTORY — DX: Age-related osteoporosis without current pathological fracture: M81.0

## 2012-10-02 HISTORY — DX: Overactive bladder: N32.81

## 2012-10-02 HISTORY — DX: Polyneuropathy, unspecified: G62.9

## 2012-10-02 HISTORY — DX: Squamous cell carcinoma of skin, unspecified: C44.92

## 2012-10-02 LAB — URINALYSIS, ROUTINE W REFLEX MICROSCOPIC
Bilirubin Urine: NEGATIVE
Glucose, UA: NEGATIVE mg/dL
Hgb urine dipstick: NEGATIVE
Ketones, ur: NEGATIVE mg/dL
Leukocytes, UA: NEGATIVE
Nitrite: NEGATIVE
Protein, ur: NEGATIVE mg/dL
Specific Gravity, Urine: 1.022 (ref 1.005–1.030)
Urobilinogen, UA: 0.2 mg/dL (ref 0.0–1.0)
pH: 7.5 (ref 5.0–8.0)

## 2012-10-02 LAB — CBC WITH DIFFERENTIAL/PLATELET
Basophils Absolute: 0 10*3/uL (ref 0.0–0.1)
Basophils Relative: 0 % (ref 0–1)
Eosinophils Absolute: 0.1 10*3/uL (ref 0.0–0.7)
Eosinophils Relative: 0 % (ref 0–5)
HCT: 29 % — ABNORMAL LOW (ref 36.0–46.0)
Hemoglobin: 9.8 g/dL — ABNORMAL LOW (ref 12.0–15.0)
Lymphocytes Relative: 9 % — ABNORMAL LOW (ref 12–46)
Lymphs Abs: 1.4 10*3/uL (ref 0.7–4.0)
MCH: 30.5 pg (ref 26.0–34.0)
MCHC: 33.8 g/dL (ref 30.0–36.0)
MCV: 90.3 fL (ref 78.0–100.0)
Monocytes Absolute: 0.9 10*3/uL (ref 0.1–1.0)
Monocytes Relative: 6 % (ref 3–12)
Neutro Abs: 13 10*3/uL — ABNORMAL HIGH (ref 1.7–7.7)
Neutrophils Relative %: 84 % — ABNORMAL HIGH (ref 43–77)
Platelets: 172 10*3/uL (ref 150–400)
RBC: 3.21 MIL/uL — ABNORMAL LOW (ref 3.87–5.11)
RDW: 13.7 % (ref 11.5–15.5)
WBC: 15.5 10*3/uL — ABNORMAL HIGH (ref 4.0–10.5)

## 2012-10-02 LAB — BASIC METABOLIC PANEL
BUN: 34 mg/dL — ABNORMAL HIGH (ref 6–23)
CO2: 24 mEq/L (ref 19–32)
Calcium: 9.2 mg/dL (ref 8.4–10.5)
Chloride: 99 mEq/L (ref 96–112)
Creatinine, Ser: 0.93 mg/dL (ref 0.50–1.10)
GFR calc Af Amer: 58 mL/min — ABNORMAL LOW (ref >90)
GFR calc non Af Amer: 50 mL/min — ABNORMAL LOW (ref >90)
Glucose, Bld: 112 mg/dL — ABNORMAL HIGH (ref 70–99)
Potassium: 3.3 mEq/L — ABNORMAL LOW (ref 3.5–5.1)
Sodium: 134 mEq/L — ABNORMAL LOW (ref 135–145)

## 2012-10-02 MED ORDER — SODIUM CHLORIDE 0.9 % IJ SOLN: 3.0000 mL | INTRAMUSCULAR | Status: DC | PRN

## 2012-10-02 MED ORDER — MORPHINE SULFATE 2 MG/ML IJ SOLN: 2.0000 mg | INTRAMUSCULAR | Status: DC | PRN

## 2012-10-02 MED ORDER — ACETAMINOPHEN 650 MG RE SUPP: 650.0000 mg | Freq: Four times a day (QID) | RECTAL | Status: DC | PRN

## 2012-10-02 MED ORDER — TETANUS-DIPHTH-ACELL PERTUSSIS 5-2.5-18.5 LF-MCG/0.5 IM SUSP
0.5000 mL | Freq: Once | INTRAMUSCULAR | Status: AC
  Administered 2012-10-02: 0.5 mL via INTRAMUSCULAR
  Filled 2012-10-02: qty 0.5

## 2012-10-02 MED ORDER — HYDROCHLOROTHIAZIDE 12.5 MG PO CAPS
12.5000 mg | ORAL_CAPSULE | Freq: Every day | ORAL | Status: DC
  Administered 2012-10-03: 12.5 mg via ORAL
  Filled 2012-10-02 (×2): qty 1

## 2012-10-02 MED ORDER — ENOXAPARIN SODIUM 30 MG/0.3ML ~~LOC~~ SOLN
30.0000 mg | Freq: Every day | SUBCUTANEOUS | Status: DC
  Administered 2012-10-02 – 2012-10-03 (×2): 30 mg via SUBCUTANEOUS
  Filled 2012-10-02 (×2): qty 0.3

## 2012-10-02 MED ORDER — ACETAMINOPHEN 325 MG PO TABS
650.0000 mg | ORAL_TABLET | Freq: Four times a day (QID) | ORAL | Status: DC | PRN
  Administered 2012-10-03: 650 mg via ORAL
  Filled 2012-10-02: qty 2

## 2012-10-02 MED ORDER — SODIUM CHLORIDE 0.9 % IV SOLN: 250.0000 mL | INTRAVENOUS | Status: DC | PRN

## 2012-10-02 MED ORDER — HYDROCODONE-ACETAMINOPHEN 5-325 MG PO TABS
1.0000 | ORAL_TABLET | ORAL | Status: DC | PRN
  Administered 2012-10-02: 1 via ORAL
  Filled 2012-10-02: qty 1

## 2012-10-02 MED ORDER — OXYCODONE-ACETAMINOPHEN 5-325 MG PO TABS
1.0000 | ORAL_TABLET | Freq: Once | ORAL | Status: AC
  Administered 2012-10-02: 1 via ORAL
  Filled 2012-10-02: qty 1

## 2012-10-02 MED ORDER — SODIUM CHLORIDE 0.9 % IJ SOLN
3.0000 mL | Freq: Two times a day (BID) | INTRAMUSCULAR | Status: DC
  Administered 2012-10-02: 3 mL via INTRAVENOUS

## 2012-10-02 MED ORDER — ALUM & MAG HYDROXIDE-SIMETH 200-200-20 MG/5ML PO SUSP: 30.0000 mL | Freq: Four times a day (QID) | ORAL | Status: DC | PRN

## 2012-10-02 MED ORDER — BENAZEPRIL HCL 20 MG PO TABS
20.0000 mg | ORAL_TABLET | Freq: Every day | ORAL | Status: DC
  Administered 2012-10-03: 20 mg via ORAL
  Filled 2012-10-02 (×2): qty 1

## 2012-10-02 MED ORDER — ONDANSETRON HCL 4 MG/2ML IJ SOLN: 4.0000 mg | Freq: Four times a day (QID) | INTRAMUSCULAR | Status: DC | PRN

## 2012-10-02 MED ORDER — SIMVASTATIN 20 MG PO TABS
20.0000 mg | ORAL_TABLET | Freq: Every evening | ORAL | Status: DC
  Administered 2012-10-02: 20 mg via ORAL
  Filled 2012-10-02 (×2): qty 1

## 2012-10-02 MED ORDER — ATENOLOL 100 MG PO TABS
100.0000 mg | ORAL_TABLET | Freq: Every morning | ORAL | Status: DC
  Administered 2012-10-03: 100 mg via ORAL
  Filled 2012-10-02 (×2): qty 1

## 2012-10-02 MED ORDER — ASPIRIN 81 MG PO CHEW
81.0000 mg | CHEWABLE_TABLET | Freq: Every morning | ORAL | Status: DC
  Administered 2012-10-03: 81 mg via ORAL
  Filled 2012-10-02 (×2): qty 1

## 2012-10-02 MED ORDER — AMLODIPINE BESYLATE 5 MG PO TABS
5.0000 mg | ORAL_TABLET | Freq: Every day | ORAL | Status: DC
  Administered 2012-10-03: 5 mg via ORAL
  Filled 2012-10-02 (×2): qty 1

## 2012-10-02 MED ORDER — POLYETHYLENE GLYCOL 3350 17 G PO PACK: 17.0000 g | PACK | Freq: Every day | ORAL | Status: DC | PRN

## 2012-10-02 MED ORDER — POTASSIUM CHLORIDE CRYS ER 20 MEQ PO TBCR
20.0000 meq | EXTENDED_RELEASE_TABLET | Freq: Every day | ORAL | Status: DC
  Administered 2012-10-02 – 2012-10-03 (×2): 20 meq via ORAL
  Filled 2012-10-02 (×3): qty 1

## 2012-10-02 MED ORDER — BENAZEPRIL-HYDROCHLOROTHIAZIDE 20-12.5 MG PO TABS: 1.0000 | ORAL_TABLET | Freq: Every day | ORAL | Status: DC

## 2012-10-02 MED ORDER — ONDANSETRON HCL 4 MG PO TABS: 4.0000 mg | ORAL_TABLET | Freq: Four times a day (QID) | ORAL | Status: DC | PRN

## 2012-10-02 MED ORDER — KETOROLAC TROMETHAMINE 30 MG/ML IJ SOLN: 15.0000 mg | Freq: Four times a day (QID) | INTRAMUSCULAR | Status: DC | PRN

## 2012-10-02 NOTE — ED Provider Notes (Signed)
CSN: [id number]     Arrival date & time 10/02/12  0912 History   First MD Initiated Contact with Patient 10/02/12 0913     Chief Complaint  Patient presents with  . Fall  . Head Laceration   (Consider location/radiation/quality/duration/timing/severity/associated sxs/prior Treatment) HPI  89yF with lower back/groin pain after fall. Fells coming down steps at home. Thinks she missed a step. Did hit head. No loc. No blood thinners aside from asa. Mild Ha. No neck pain. No acute visual complaints, numbness, tingling or loss of strength. Has not tried ambulating since fall. Baseline mental status per family at bedside.   Past Medical History  Diagnosis Date  . Hypertension   . High cholesterol   . Spinal stenosis    History reviewed. No pertinent past surgical history. No family history on file. History  Substance Use Topics  . Smoking status: Never Smoker   . Smokeless tobacco: Not on file  . Alcohol Use: No   OB History   Grav Para Term Preterm Abortions TAB SAB Ect Mult Living                 Review of Systems  All systems reviewed and negative, other than as noted in HPI.   Allergies  Penicillins  Home Medications   Current Outpatient Rx  Name  Route  Sig  Dispense  Refill  . amLODipine (NORVASC) 5 MG tablet   Oral   Take 5 mg by mouth daily.         Marland Kitchen aspirin 81 MG chewable tablet   Oral   Chew 81 mg by mouth every morning.          Marland Kitchen atenolol (TENORMIN) 100 MG tablet   Oral   Take 100 mg by mouth every morning.          . benazepril-hydrochlorthiazide (LOTENSIN HCT) 20-12.5 MG per tablet   Oral   Take 1 tablet by mouth daily.         Marland Kitchen ibandronate (BONIVA) 150 MG tablet   Oral   Take 150 mg by mouth every 30 (thirty) days. Take in the morning with a full glass of water, on an empty stomach, and do not take anything else by mouth or lie down for the next 30 min.         . simvastatin (ZOCOR) 40 MG tablet   Oral   Take 20 mg by mouth every  evening.          BP 137/63  Pulse 64  Temp(Src) 98.5 F (36.9 C) (Oral)  Resp 14  SpO2 92% Physical Exam  Nursing note and vitals reviewed. Constitutional: She appears well-developed and well-nourished. No distress.  HENT:  Head: Normocephalic and atraumatic.  R scalp laceration 2cm  Eyes: Conjunctivae are normal. Right eye exhibits no discharge. Left eye exhibits no discharge.  Neck: Neck supple.  Cardiovascular: Normal rate, regular rhythm and normal heart sounds.  Exam reveals no gallop and no friction rub.   No murmur heard. Pulmonary/Chest: Effort normal and breath sounds normal. No respiratory distress.  Abdominal: Soft. She exhibits no distension. There is no tenderness.  Musculoskeletal: She exhibits no edema and no tenderness.  B/l LE not shortened or malrotated. Able to range b/l hips fairly comfortably although reports increased back pain with strength leg raise. No midline spinal tenderness.  Neurological: She is alert.  Skin: Skin is warm and dry.  Psychiatric: She has a normal mood and affect. Her behavior is normal.  Thought content normal.    ED Course  Procedures (including critical care time)  LACERATION REPAIR Performed by: Raeford Razor Authorized by: Raeford Razor Consent: Verbal consent obtained. Risks and benefits: risks, benefits and alternatives were discussed Consent given by: patient Patient identity confirmed: provided demographic data Prepped and Draped in normal sterile fashion Wound explored  Laceration Location: R scalp  Laceration Length: 2 cm  No Foreign Bodies seen or palpated  Anesthesia: local infiltration  Local anesthetic:none Anesthetic total: n/a Irrigation method: syringe Amount of cleaning: standard  Skin closure: single layer  Number of sutures: 4  Technique:staple Patient tolerance: Patient tolerated the procedure well with no immediate complications.  Labs Review Labs Reviewed  CBC WITH DIFFERENTIAL -  Abnormal; Notable for the following:    WBC 15.5 (*)    RBC 3.21 (*)    Hemoglobin 9.8 (*)    HCT 29.0 (*)    Neutrophils Relative % 84 (*)    Neutro Abs 13.0 (*)    Lymphocytes Relative 9 (*)    All other components within normal limits  BASIC METABOLIC PANEL - Abnormal; Notable for the following:    Sodium 134 (*)    Potassium 3.3 (*)    Glucose, Bld 112 (*)    BUN 34 (*)    GFR calc non Af Amer 50 (*)    GFR calc Af Amer 58 (*)    All other components within normal limits  URINALYSIS, ROUTINE W REFLEX MICROSCOPIC   Imaging Review Dg Pelvis 1-2 Views  10/02/2012   *RADIOLOGY REPORT*  Clinical Data: History of fall.  PELVIS - 1-2 VIEW  Comparison: No priors.  Findings: A single AP view of the pelvis demonstrates no acute displaced fracture of the bony pelvic ring.  There is mild irregularity of the right inferior pubic ramus, which may represent an old healed fracture.  Bilateral proximal femurs as visualized appear intact, the femoral heads project over the acetabula bilaterally.  A large calcification in the right hemipelvis likely represents a calcified fibroid.  IMPRESSION: 1.  No definite acute radiographic abnormality of the bony pelvis.   Original Report Authenticated By: Trudie Reed, M.D.   Dg Sacrum/coccyx  10/02/2012   *RADIOLOGY REPORT*  Clinical Data: History of fall.  Buttock pain.  Hip pain.  SACRUM AND COCCYX - 2+ VIEW  Comparison: Pelvis radiograph 10/02/2012.  Findings: Three views of the sacrum and coccyx are limited by overlying bowel contents on the frontal projections.  The lateral projection demonstrates no definite acute displaced fracture of the sacrum or coccyx.  Visualized portions of the bony pelvis otherwise appear intact, although there is mild irregularity of the inferior pubic ramus on the right, which may represent an old healed fracture.  Large coarse calcification in the right hemipelvis is most compatible with a calcified fibroid.  IMPRESSION: 1.   Slightly limited examination demonstrating no definite acute displaced fracture of the sacrum or coccyx.   Original Report Authenticated By: Trudie Reed, M.D.   Ct Head Wo Contrast  10/02/2012   *RADIOLOGY REPORT*  Clinical Data:  History of fall.  Laceration to the head.  CT HEAD WITHOUT CONTRAST CT CERVICAL SPINE WITHOUT CONTRAST  Technique:  Multidetector CT imaging of the head and cervical spine was performed following the standard protocol without intravenous contrast.  Multiplanar CT image reconstructions of the cervical spine were also generated.  Comparison:  Head CT 04/29/2011.  CT HEAD  Findings: There is some soft tissue swelling in the right occipital  scalp.  Moderate cerebral and cerebellar atrophy.  Extensive patchy and confluent areas of decreased attenuation throughout the deep and periventricular white matter of the cerebral hemispheres bilaterally, compatible with chronic microvascular ischemic disease. No acute displaced skull fractures are identified.  No acute intracranial abnormality.  Specifically, no evidence of acute post-traumatic intracranial hemorrhage, no definite regions of acute/subacute cerebral ischemia, no focal mass, mass effect, hydrocephalus or abnormal intra or extra-axial fluid collections. Visualized paranasal sinuses and mastoids are well pneumatized, with exception of a small low attenuation air fluid level in the right maxillary sinus.  IMPRESSION: 1.  Right occipital scalp hematoma/contusion, without underlying displaced skull fracture or signs of significant acute intracranial trauma. 2.  Moderate cerebral and cerebellar atrophy with extensive chronic microvascular ischemic changes throughout the cerebral white matter.  CT CERVICAL SPINE  Findings: No acute displaced fractures of the cervical spine. Prevertebral soft tissues are normal.  Multilevel degenerative disc disease, most severe at C4-C5, C5-C6 and C6-C7.  Multilevel facet arthropathy is also noted.  There  is 4 mm of anterolisthesis of C6 upon C7, favored to be chronic.  Alignment is otherwise anatomic. Visualized portions of the upper thorax are remarkable for bilateral apical pleuroparenchymal thickening.  In addition, there is a 7 mm nodular-appearing pleural-based density in the posterior right upper lobe (image 59 of series 6), as well as a 3 mm right upper lobe nodule (image 60 of series 6).  IMPRESSION: 1.  No evidence of significant acute traumatic injury to the cervical spine. 2.  Multilevel degenerative disc disease and cervical spondylosis, as above. 3.  7 mm pleural based nodular density in the posterior aspect of the right upper lobe.  This is favored to represent part of the pleuroparenchymal scarring which is seen throughout the remaining portions of the apices of the lungs bilaterally, however, this is slightly more nodular in appearance in the other regions, and may warrant attention on follow-up studies if clinically indicated. If the patient is at high risk for bronchogenic carcinoma, follow-up chest CT at 3-6 months is recommended.  If the patient is at low risk for bronchogenic carcinoma, follow-up chest CT at 6-12 months is recommended.  This recommendation follows the consensus statement: Guidelines for Management of Small Pulmonary Nodules Detected on CT Scans: A Statement from the Fleischner Society as published in Radiology 2005; 237:395-400.   Original Report Authenticated By: Trudie Reed, M.D.   Ct Cervical Spine Wo Contrast  10/02/2012   *RADIOLOGY REPORT*  Clinical Data:  History of fall.  Laceration to the head.  CT HEAD WITHOUT CONTRAST CT CERVICAL SPINE WITHOUT CONTRAST  Technique:  Multidetector CT imaging of the head and cervical spine was performed following the standard protocol without intravenous contrast.  Multiplanar CT image reconstructions of the cervical spine were also generated.  Comparison:  Head CT 04/29/2011.  CT HEAD  Findings: There is some soft tissue swelling  in the right occipital scalp.  Moderate cerebral and cerebellar atrophy.  Extensive patchy and confluent areas of decreased attenuation throughout the deep and periventricular white matter of the cerebral hemispheres bilaterally, compatible with chronic microvascular ischemic disease. No acute displaced skull fractures are identified.  No acute intracranial abnormality.  Specifically, no evidence of acute post-traumatic intracranial hemorrhage, no definite regions of acute/subacute cerebral ischemia, no focal mass, mass effect, hydrocephalus or abnormal intra or extra-axial fluid collections. Visualized paranasal sinuses and mastoids are well pneumatized, with exception of a small low attenuation air fluid level in the right maxillary sinus.  IMPRESSION: 1.  Right occipital scalp hematoma/contusion, without underlying displaced skull fracture or signs of significant acute intracranial trauma. 2.  Moderate cerebral and cerebellar atrophy with extensive chronic microvascular ischemic changes throughout the cerebral white matter.  CT CERVICAL SPINE  Findings: No acute displaced fractures of the cervical spine. Prevertebral soft tissues are normal.  Multilevel degenerative disc disease, most severe at C4-C5, C5-C6 and C6-C7.  Multilevel facet arthropathy is also noted.  There is 4 mm of anterolisthesis of C6 upon C7, favored to be chronic.  Alignment is otherwise anatomic. Visualized portions of the upper thorax are remarkable for bilateral apical pleuroparenchymal thickening.  In addition, there is a 7 mm nodular-appearing pleural-based density in the posterior right upper lobe (image 59 of series 6), as well as a 3 mm right upper lobe nodule (image 60 of series 6).  IMPRESSION: 1.  No evidence of significant acute traumatic injury to the cervical spine. 2.  Multilevel degenerative disc disease and cervical spondylosis, as above. 3.  7 mm pleural based nodular density in the posterior aspect of the right upper lobe.   This is favored to represent part of the pleuroparenchymal scarring which is seen throughout the remaining portions of the apices of the lungs bilaterally, however, this is slightly more nodular in appearance in the other regions, and may warrant attention on follow-up studies if clinically indicated. If the patient is at high risk for bronchogenic carcinoma, follow-up chest CT at 3-6 months is recommended.  If the patient is at low risk for bronchogenic carcinoma, follow-up chest CT at 6-12 months is recommended.  This recommendation follows the consensus statement: Guidelines for Management of Small Pulmonary Nodules Detected on CT Scans: A Statement from the Fleischner Society as published in Radiology 2005; 237:395-400.   Original Report Authenticated By: Trudie Reed, M.D.   Ct Pelvis Wo Contrast  10/02/2012   *RADIOLOGY REPORT*  Clinical Data: Larey Seat.  Right-sided pelvic pain.  CT PELVIS WITHOUT CONTRAST  Technique:  Multidetector CT imaging of the pelvis was performed following the standard protocol without intravenous contrast.  Comparison: Radiographs 10/02/2012.  Findings: There are right-sided sacral fractures and right-sided superior and inferior pubic rami fractures.  No significant displacement.  Both hips are normally located.  No hip fracture. Moderate hip joint degenerative changes bilaterally and chondrocalcinosis.  Lower lumbar degenerative changes vertically involving the facet joints.  A large calcified uterine fibroid is noted on the right side.  No significant intrapelvic abnormalities.  Advanced atherosclerotic calcifications involving the aorta and iliac arteries.  IMPRESSION:  1.  Right-sided sacral fractures. 2.  Right-sided superior and inferior pubic rami fractures.   Original Report Authenticated By: Rudie Meyer, M.D.    MDM   1. Sacral fracture, closed, initial encounter   2. Fracture of multiple pubic rami, right, closed, initial encounter   3. Scalp laceration, initial  encounter   4. Fall at home, initial encounter   5. Lung nodule   6. HTN (hypertension)   7. Hyperlipidemia   8. Spinal stenosis   9. Neuropathy   10. Osteoporosis   11. Hypokalemia   12. Leukocytosis   13. Urinary frequency    89yF with scalp lac and pubic rami/sacral fxs. Unable to ambulate. Will admit. Likely placement.     Raeford Razor, MD 10/09/12 2226

## 2012-10-02 NOTE — ED Notes (Signed)
Per ems: pt from home, was ambulating with walker, fell down and hit her head on bricks. No LOC but does not remember why she was outside walking. Family with pt at time of fall, but did not witness. Laceration behind right ear. C/o pain to right ear, right arm and joint pain to bilat legs. C-collar on and aligned. bp 136/70, pulse 70, respirations 16

## 2012-10-02 NOTE — Evaluation (Signed)
Occupational Therapy Evaluation Patient Details Name: Ashlee [name] MRN: [id number] DOB: [birth year] Today's Date: 10/02/2012 Time: 4098-1191 OT Time Calculation (min): 28 min  OT Assessment / Plan / Recommendation History of present illness 79 y.o. remarkably independent female with a PMH of HTN, spinal stenosis, osteoporosis and hyperlipidemia who lost her footing when coming down steps at home, fell, and struck her head on some bricks.  Denies LOC.  Fall was witnessed by family.  Family was unable to help her to her feet and called 911.  Upon initial evaluation in the ER, the patient was noted to have right sided sacral and superior and inferior pubic rami fractures along with a right occipital scalp laceration which was stapled.    Clinical Impression   Pt was admitted for fall with R sided sacral and superior/inferior pubic rami fractures and R scalp laceration.  Prior to admission, she was mod I for all adls.  She is appropriate for skilled OT with min A level goals for transfers and standing for adls.      OT Assessment  Patient needs continued OT Services    Follow Up Recommendations  SNF    Barriers to Discharge      Equipment Recommendations  3 in 1 bedside comode    Recommendations for Other Services    Frequency  Min 2X/week    Precautions / Restrictions Precautions Precautions: Fall Restrictions Weight Bearing Restrictions: No RLE Weight Bearing: Weight bearing as tolerated   Pertinent Vitals/Pain Pain in groin, legs, arms, neck.  Not rated.  Pt was premedicated and repositioned in bed    ADL  Grooming: Set up (x hair due to laceration) Where Assessed - Grooming: Unsupported sitting Upper Body Bathing: Set up Where Assessed - Upper Body Bathing: Unsupported sitting Lower Body Bathing: +2 Total assistance Lower Body Bathing: Patient Percentage: 30% Where Assessed - Lower Body Bathing: Supported sit to stand Upper Body Dressing: Minimal assistance Where  Assessed - Upper Body Dressing: Unsupported sitting Lower Body Dressing: +2 Total assistance Lower Body Dressing: Patient Percentage: 10% Where Assessed - Lower Body Dressing: Supported sit to stand Toilet Transfer: Performed;+2 Total assistance Toilet Transfer: Patient Percentage: 50% Toilet Transfer Method: Stand pivot (max cues, multimodal at times) Acupuncturist: Materials engineer and Hygiene: +2 Total assistance Toileting - Architect and Hygiene: Patient Percentage: 0% Where Assessed - Glass blower/designer Manipulation and Hygiene: Standing Equipment Used: Rolling walker Transfers/Ambulation Related to ADLs: performed spt to 3:1.  Pt fearful of falling and had difficulty following some motoric commands to get feet in correct position.  Also held her hand to release from walker and transition to 3:1 armrests ADL Comments: did not introduce AE yet.  Pt with pain in groin area and soreness in neck, arms, legs.      OT Diagnosis: Generalized weakness  OT Problem List: Decreased strength;Decreased activity tolerance;Impaired balance (sitting and/or standing);Pain;Decreased knowledge of use of DME or AE OT Treatment Interventions: Self-care/ADL training;DME and/or AE instruction;Patient/family education;Balance training;Therapeutic activities   OT Goals(Current goals can be found in the care plan section) Acute Rehab OT Goals Patient Stated Goal: less pain.  OT Goal Formulation: With patient Time For Goal Achievement: 10/09/12 Potential to Achieve Goals: Good ADL Goals Pt Will Transfer to Toilet: with min assist;stand pivot transfer;bedside commode Pt Will Perform Toileting - Clothing Manipulation and hygiene: with min assist;sit to/from stand Additional ADL Goal #1: pt will maintain static supported standing for 2 minutes for adls with min  guard assist Additional ADL Goal #2: pt will use reacher for adls from sitting level with min A  and cues  Visit Information  Last OT Received On: 10/02/12 Assistance Needed: +2 History of Present Illness: 79 y.o. remarkably independent female with a PMH of HTN, spinal stenosis, osteoporosis and hyperlipidemia who lost her footing when coming down steps at home, fell, and struck her head on some bricks.  Denies LOC.  Fall was witnessed by family.  Family was unable to help her to her feet and called 911.  Upon initial evaluation in the ER, the patient was noted to have right sided sacral and superior and inferior pubic rami fractures along with a right occipital scalp laceration which was stapled.        Prior Functioning     Home Living Family/patient expects to be discharged to:: Skilled nursing facility Living Arrangements: Alone Prior Function Level of Independence: Independent with assistive device(s) Communication Communication: No difficulties         Vision/Perception     Cognition  Cognition Arousal/Alertness: Awake/alert Behavior During Therapy: WFL for tasks assessed/performed Overall Cognitive Status: Within Functional Limits for tasks assessed    Extremity/Trunk Assessment Upper Extremity Assessment Upper Extremity Assessment: Generalized weakness (c/o soreness bil triceps area) Lower Extremity Assessment Lower Extremity Assessment: Generalized weakness Cervical / Trunk Assessment Cervical / Trunk Assessment: Kyphotic     Mobility Bed Mobility Bed Mobility: Supine to Sit;Sit to Supine Supine to Sit: 3: Mod assist Sit to Supine: 3: Mod assist Details for Bed Mobility Assistance: Assist for bil LEs and trunk.  Transfers Sit to Stand: 1: +2 Total assist;From bed;From chair/3-in-1 Sit to Stand: Patient Percentage: 50% Stand to Sit: 1: +2 Total assist Stand to Sit: Patient Percentage: 50% Details for Transfer Assistance: assist to rise, stabilize, control descent, maneuver with RW. Increased time to complete all tasks.      Exercise     Balance      End of Session OT - End of Session Activity Tolerance: Patient limited by fatigue;Patient limited by pain Patient left: in bed;with call bell/phone within reach  GO     [facility] 10/02/2012, 4:03 PM Marica Otter, OTR/L [phone] 10/02/2012

## 2012-10-02 NOTE — ED Notes (Signed)
Patient transported to CT 

## 2012-10-02 NOTE — Evaluation (Signed)
Physical Therapy Evaluation Patient Details Name: Ashlee [name] MRN: [id number] DOB: [birth year] Today's Date: 10/02/2012 Time: 4098-1191 PT Time Calculation (min): 28 min  PT Assessment / Plan / Recommendation History of Present Illness  79 y.o. remarkably independent female with a PMH of HTN, spinal stenosis, osteoporosis and hyperlipidemia who lost her footing when coming down steps at home, fell, and struck her head on some bricks.  Denies LOC.  Fall was witnessed by family.  Family was unable to help her to her feet and called 911.  Upon initial evaluation in the ER, the patient was noted to have right sided sacral and superior and inferior pubic rami fractures along with a right occipital scalp laceration which was stapled.   Clinical Impression  On eval, pt required +2 assist for mobility-able to pivot to [facility] with RW and increased time. Recommend SNF for rehab.     PT Assessment  Patient needs continued PT services    Follow Up Recommendations  SNF    Does the patient have the potential to tolerate intense rehabilitation      Barriers to Discharge        Equipment Recommendations  None recommended by PT    Recommendations for Other Services OT consult   Frequency Min 3X/week    Precautions / Restrictions Precautions Precautions: Fall Restrictions Weight Bearing Restrictions: No RLE Weight Bearing: Weight bearing as tolerated   Pertinent Vitals/Pain R groin/LE-7/10 with activity      Mobility  Bed Mobility Bed Mobility: Supine to Sit;Sit to Supine Supine to Sit: 3: Mod assist Sit to Supine: 3: Mod assist Details for Bed Mobility Assistance: Assist for bil LEs and trunk.  Transfers Transfers: Sit to Stand;Stand to Sit;Stand Pivot Transfers Sit to Stand: 1: +2 Total assist;From bed;From chair/3-in-1 Sit to Stand: Patient Percentage: 50% Stand to Sit: 1: +2 Total assist Stand to Sit: Patient Percentage: 50% Stand Pivot Transfers: 1: +2 Total assist Stand  Pivot Transfers: Patient Percentage: 50% Details for Transfer Assistance: assist to rise, stabilize, control descent, maneuver with RW. Increased time to complete all tasks.  Ambulation/Gait Ambulation/Gait Assistance: Not tested (comment)    Exercises     PT Diagnosis: Difficulty walking;Abnormality of gait;Acute pain;Generalized weakness  PT Problem List: Decreased strength;Decreased range of motion;Decreased activity tolerance;Decreased mobility;Pain;Decreased knowledge of precautions;Decreased knowledge of use of DME;Decreased balance PT Treatment Interventions: DME instruction;Gait training;Functional mobility training;Therapeutic activities;Therapeutic exercise;Patient/family education     PT Goals(Current goals can be found in the care plan section) Acute Rehab PT Goals Patient Stated Goal: less pain.  PT Goal Formulation: With patient Time For Goal Achievement: 10/16/12 Potential to Achieve Goals: Good  Visit Information  Last PT Received On: 10/02/12 Assistance Needed: +2 PT/OT Co-Evaluation/Treatment: Yes History of Present Illness: 79 y.o. remarkably independent female with a PMH of HTN, spinal stenosis, osteoporosis and hyperlipidemia who lost her footing when coming down steps at home, fell, and struck her head on some bricks.  Denies LOC.  Fall was witnessed by family.  Family was unable to help her to her feet and called 911.  Upon initial evaluation in the ER, the patient was noted to have right sided sacral and superior and inferior pubic rami fractures along with a right occipital scalp laceration which was stapled.        Prior Functioning  Home Living Family/patient expects to be discharged to:: Skilled nursing facility Living Arrangements: Alone Prior Function Level of Independence: Independent with assistive device(s) Communication Communication: No difficulties  Cognition  Cognition Arousal/Alertness: Awake/alert Behavior During Therapy: WFL for tasks  assessed/performed Overall Cognitive Status: Within Functional Limits for tasks assessed    Extremity/Trunk Assessment Upper Extremity Assessment Upper Extremity Assessment: Generalized weakness Lower Extremity Assessment Lower Extremity Assessment: Generalized weakness Cervical / Trunk Assessment Cervical / Trunk Assessment: Kyphotic   Balance    End of Session PT - End of Session Equipment Utilized During Treatment: Gait belt Activity Tolerance: Patient limited by pain Patient left: in bed;with call bell/phone within reach  GP     Rebeca Alert, MPT Pager: [phone]

## 2012-10-02 NOTE — Progress Notes (Signed)
[facility] ED Cm consulted with Dr Darnelle Catalan (admission)

## 2012-10-02 NOTE — Progress Notes (Signed)
UR completed 

## 2012-10-02 NOTE — Progress Notes (Signed)
Patient declines MyChart activation-does not have a computer 

## 2012-10-02 NOTE — H&P (Signed)
[facility] History and Physical  Ashlee [name] WGN:[id number] DOB: Jun 18, 1914 DOA: 10/02/2012  Referring physician: Dr. Raeford Razor PCP: Ezequiel Kayser, MD   Chief Complaint: Fall with pelvic pain   History of Present Illness: Ashlee [name] is an 79 y.o. remarkably independent female with a PMH of HTN, spinal stenosis, osteoporosis and hyperlipidemia who lost her footing when coming down steps at home, fell, and struck her head on some bricks.  Denies LOC.  Fall was witnessed by family.  Family was unable to help her to her feet and called 911.  Upon initial evaluation in the ER, the patient was noted to have right sided sacral and superior and inferior pubic rami fractures along with a right occipital scalp laceration which was stapled.  The patient states she has a long standing history of spinal stenosis, but that over the past several weeks, she has been bothered more by lower extremity "achiness" and difficulty with her gait, needing to rely on her walker more.  No aggravating or alleviating factors.  Review of Systems: Constitutional: No fever, no chills;  Appetite normal; No weight loss, no weight gain, no fatigue.  HEENT: No blurry vision, no diplopia, no pharyngitis, no dysphagia CV: No chest pain, no palpitations.  Resp: No SOB, no cough. GI: No nausea, no vomiting, no diarrhea, no melena, no hematochezia.  GU: No dysuria, no hematuria, + frequency. MSK: + lower extremity myalgias with gait impairment, no arthralgias.  Neuro:  No headache, no focal neurological deficits, no history of seizures.  Psych: No depression, no anxiety.  Endo: No heat intolerance, no cold intolerance, no polyuria, no polydipsia  Skin: No rashes, + skin bruising secondary to trauma.  Heme: No easy bruising.  Past Medical History Past Medical History  Diagnosis Date  . Hypertension   . High cholesterol   . Spinal stenosis   . Osteoporosis   . Neuropathy   . Squamous cell skin cancer   .  Overactive bladder      Past Surgical History Past Surgical History  Procedure Laterality Date  . Tonsillectomy      20's     Social History: History   Social History  . Marital Status: Widowed    Spouse Name: N/A    Number of Children: 0  . Years of Education: N/A   Occupational History  . Retired Catering manager     Social History Main Topics  . Smoking status: Never Smoker   . Smokeless tobacco: Not on file  . Alcohol Use: Yes     Comment: Rare wine  . Drug Use: No  . Sexual Activity: Not on file   Other Topics Concern  . Not on file   Social History Narrative   Widowed.  Lives alone.  Ambulates with a walker.    Family History:  Family History  Problem Relation Age of Onset  . Heart failure Mother   . Diabetes Brother     Allergies: Penicillins  Meds: Prior to Admission medications   Medication Sig Start Date End Date Taking? Authorizing Provider  amLODipine (NORVASC) 5 MG tablet Take 5 mg by mouth daily.   Yes Historical Provider, MD  aspirin 81 MG chewable tablet Chew 81 mg by mouth every morning.    Yes Historical Provider, MD  atenolol (TENORMIN) 100 MG tablet Take 100 mg by mouth every morning.    Yes Historical Provider, MD  benazepril-hydrochlorthiazide (LOTENSIN HCT) 20-12.5 MG per tablet Take 1 tablet by mouth daily.  Yes Historical Provider, MD  ibandronate (BONIVA) 150 MG tablet Take 150 mg by mouth every 30 (thirty) days. Take in the morning with a full glass of water, on an empty stomach, and do not take anything else by mouth or lie down for the next 30 min.   Yes Historical Provider, MD  simvastatin (ZOCOR) 40 MG tablet Take 20 mg by mouth every evening.   Yes Historical Provider, MD    Physical Exam: Filed Vitals:   10/02/12 0914  BP: 137/63  Pulse: 64  Temp: 98.5 F (36.9 C)  TempSrc: Oral  Resp: 14  SpO2: 92%     Physical Exam: Blood pressure 137/63, pulse 64, temperature 98.5 F (36.9 C), temperature source Oral, resp. rate  14, SpO2 92.00%. Gen: No acute distress. Head: Posterior occipital laceration with staples. Eyes: PERRL, EOMI, sclerae nonicteric. Mouth: Oropharynx clear.  No posterior pharyngeal exudates. Neck: Supple, no thyromegaly, no lymphadenopathy, no jugular venous distention. Chest: Lungs clear to auscultation bilaterally. CV: Heart sounds are regular. No murmurs, rubs, or gallops. Abdomen: Soft, nontender, nondistended with normal active bowel sounds. Extremities: Extremities are without clubbing, edema, or cyanosis. Skin: Warm and dry. Posterior scalp laceration and scattered ecchymotic areas. Neuro: Alert and oriented times 3; cranial nerves II through XII grossly intact. Psych: Mood and affect normal.  Labs on Admission:  Basic Metabolic Panel:  Recent Labs Lab 10/02/12 1145  NA 134*  K 3.3*  CL 99  CO2 24  GLUCOSE 112*  BUN 34*  CREATININE 0.93  CALCIUM 9.2   CBC:  Recent Labs Lab 10/02/12 1145  WBC 15.5*  NEUTROABS 13.0*  HGB 9.8*  HCT 29.0*  MCV 90.3  PLT 172   Urinalysis    Component Value Date/Time   COLORURINE YELLOW 10/02/2012 1021   APPEARANCEUR CLEAR 10/02/2012 1021   LABSPEC 1.022 10/02/2012 1021   PHURINE 7.5 10/02/2012 1021   GLUCOSEU NEGATIVE 10/02/2012 1021   HGBUR NEGATIVE 10/02/2012 1021   BILIRUBINUR NEGATIVE 10/02/2012 1021   KETONESUR NEGATIVE 10/02/2012 1021   PROTEINUR NEGATIVE 10/02/2012 1021   UROBILINOGEN 0.2 10/02/2012 1021   NITRITE NEGATIVE 10/02/2012 1021   LEUKOCYTESUR NEGATIVE 10/02/2012 1021      Radiological Exams on Admission: Dg Pelvis 1-2 Views  10/02/2012   *RADIOLOGY REPORT*  Clinical Data: History of fall.  PELVIS - 1-2 VIEW  Comparison: No priors.  Findings: A single AP view of the pelvis demonstrates no acute displaced fracture of the bony pelvic ring.  There is mild irregularity of the right inferior pubic ramus, which may represent an old healed fracture.  Bilateral proximal femurs as visualized appear intact, the femoral heads  project over the acetabula bilaterally.  A large calcification in the right hemipelvis likely represents a calcified fibroid.  IMPRESSION: 1.  No definite acute radiographic abnormality of the bony pelvis.   Original Report Authenticated By: Trudie Reed, M.D.   Dg Sacrum/coccyx  10/02/2012   *RADIOLOGY REPORT*  Clinical Data: History of fall.  Buttock pain.  Hip pain.  SACRUM AND COCCYX - 2+ VIEW  Comparison: Pelvis radiograph 10/02/2012.  Findings: Three views of the sacrum and coccyx are limited by overlying bowel contents on the frontal projections.  The lateral projection demonstrates no definite acute displaced fracture of the sacrum or coccyx.  Visualized portions of the bony pelvis otherwise appear intact, although there is mild irregularity of the inferior pubic ramus on the right, which may represent an old healed fracture.  Large coarse calcification in the right  hemipelvis is most compatible with a calcified fibroid.  IMPRESSION: 1.  Slightly limited examination demonstrating no definite acute displaced fracture of the sacrum or coccyx.   Original Report Authenticated By: Trudie Reed, M.D.   Ct Head Wo Contrast  10/02/2012   *RADIOLOGY REPORT*  Clinical Data:  History of fall.  Laceration to the head.  CT HEAD WITHOUT CONTRAST CT CERVICAL SPINE WITHOUT CONTRAST  Technique:  Multidetector CT imaging of the head and cervical spine was performed following the standard protocol without intravenous contrast.  Multiplanar CT image reconstructions of the cervical spine were also generated.  Comparison:  Head CT 04/29/2011.  CT HEAD  Findings: There is some soft tissue swelling in the right occipital scalp.  Moderate cerebral and cerebellar atrophy.  Extensive patchy and confluent areas of decreased attenuation throughout the deep and periventricular white matter of the cerebral hemispheres bilaterally, compatible with chronic microvascular ischemic disease. No acute displaced skull fractures are  identified.  No acute intracranial abnormality.  Specifically, no evidence of acute post-traumatic intracranial hemorrhage, no definite regions of acute/subacute cerebral ischemia, no focal mass, mass effect, hydrocephalus or abnormal intra or extra-axial fluid collections. Visualized paranasal sinuses and mastoids are well pneumatized, with exception of a small low attenuation air fluid level in the right maxillary sinus.  IMPRESSION: 1.  Right occipital scalp hematoma/contusion, without underlying displaced skull fracture or signs of significant acute intracranial trauma. 2.  Moderate cerebral and cerebellar atrophy with extensive chronic microvascular ischemic changes throughout the cerebral white matter.  CT CERVICAL SPINE  Findings: No acute displaced fractures of the cervical spine. Prevertebral soft tissues are normal.  Multilevel degenerative disc disease, most severe at C4-C5, C5-C6 and C6-C7.  Multilevel facet arthropathy is also noted.  There is 4 mm of anterolisthesis of C6 upon C7, favored to be chronic.  Alignment is otherwise anatomic. Visualized portions of the upper thorax are remarkable for bilateral apical pleuroparenchymal thickening.  In addition, there is a 7 mm nodular-appearing pleural-based density in the posterior right upper lobe (image 59 of series 6), as well as a 3 mm right upper lobe nodule (image 60 of series 6).  IMPRESSION: 1.  No evidence of significant acute traumatic injury to the cervical spine. 2.  Multilevel degenerative disc disease and cervical spondylosis, as above. 3.  7 mm pleural based nodular density in the posterior aspect of the right upper lobe.  This is favored to represent part of the pleuroparenchymal scarring which is seen throughout the remaining portions of the apices of the lungs bilaterally, however, this is slightly more nodular in appearance in the other regions, and may warrant attention on follow-up studies if clinically indicated. If the patient is at  high risk for bronchogenic carcinoma, follow-up chest CT at 3-6 months is recommended.  If the patient is at low risk for bronchogenic carcinoma, follow-up chest CT at 6-12 months is recommended.  This recommendation follows the consensus statement: Guidelines for Management of Small Pulmonary Nodules Detected on CT Scans: A Statement from the Fleischner Society as published in Radiology 2005; 237:395-400.   Original Report Authenticated By: Trudie Reed, M.D.   Ct Cervical Spine Wo Contrast  10/02/2012   *RADIOLOGY REPORT*  Clinical Data:  History of fall.  Laceration to the head.  CT HEAD WITHOUT CONTRAST CT CERVICAL SPINE WITHOUT CONTRAST  Technique:  Multidetector CT imaging of the head and cervical spine was performed following the standard protocol without intravenous contrast.  Multiplanar CT image reconstructions of the cervical spine  were also generated.  Comparison:  Head CT 04/29/2011.  CT HEAD  Findings: There is some soft tissue swelling in the right occipital scalp.  Moderate cerebral and cerebellar atrophy.  Extensive patchy and confluent areas of decreased attenuation throughout the deep and periventricular white matter of the cerebral hemispheres bilaterally, compatible with chronic microvascular ischemic disease. No acute displaced skull fractures are identified.  No acute intracranial abnormality.  Specifically, no evidence of acute post-traumatic intracranial hemorrhage, no definite regions of acute/subacute cerebral ischemia, no focal mass, mass effect, hydrocephalus or abnormal intra or extra-axial fluid collections. Visualized paranasal sinuses and mastoids are well pneumatized, with exception of a small low attenuation air fluid level in the right maxillary sinus.  IMPRESSION: 1.  Right occipital scalp hematoma/contusion, without underlying displaced skull fracture or signs of significant acute intracranial trauma. 2.  Moderate cerebral and cerebellar atrophy with extensive chronic  microvascular ischemic changes throughout the cerebral white matter.  CT CERVICAL SPINE  Findings: No acute displaced fractures of the cervical spine. Prevertebral soft tissues are normal.  Multilevel degenerative disc disease, most severe at C4-C5, C5-C6 and C6-C7.  Multilevel facet arthropathy is also noted.  There is 4 mm of anterolisthesis of C6 upon C7, favored to be chronic.  Alignment is otherwise anatomic. Visualized portions of the upper thorax are remarkable for bilateral apical pleuroparenchymal thickening.  In addition, there is a 7 mm nodular-appearing pleural-based density in the posterior right upper lobe (image 59 of series 6), as well as a 3 mm right upper lobe nodule (image 60 of series 6).  IMPRESSION: 1.  No evidence of significant acute traumatic injury to the cervical spine. 2.  Multilevel degenerative disc disease and cervical spondylosis, as above. 3.  7 mm pleural based nodular density in the posterior aspect of the right upper lobe.  This is favored to represent part of the pleuroparenchymal scarring which is seen throughout the remaining portions of the apices of the lungs bilaterally, however, this is slightly more nodular in appearance in the other regions, and may warrant attention on follow-up studies if clinically indicated. If the patient is at high risk for bronchogenic carcinoma, follow-up chest CT at 3-6 months is recommended.  If the patient is at low risk for bronchogenic carcinoma, follow-up chest CT at 6-12 months is recommended.  This recommendation follows the consensus statement: Guidelines for Management of Small Pulmonary Nodules Detected on CT Scans: A Statement from the Fleischner Society as published in Radiology 2005; 237:395-400.   Original Report Authenticated By: Trudie Reed, M.D.   Ct Pelvis Wo Contrast  10/02/2012   *RADIOLOGY REPORT*  Clinical Data: Larey Seat.  Right-sided pelvic pain.  CT PELVIS WITHOUT CONTRAST  Technique:  Multidetector CT imaging of the  pelvis was performed following the standard protocol without intravenous contrast.  Comparison: Radiographs 10/02/2012.  Findings: There are right-sided sacral fractures and right-sided superior and inferior pubic rami fractures.  No significant displacement.  Both hips are normally located.  No hip fracture. Moderate hip joint degenerative changes bilaterally and chondrocalcinosis.  Lower lumbar degenerative changes vertically involving the facet joints.  A large calcified uterine fibroid is noted on the right side.  No significant intrapelvic abnormalities.  Advanced atherosclerotic calcifications involving the aorta and iliac arteries.  IMPRESSION:  1.  Right-sided sacral fractures. 2.  Right-sided superior and inferior pubic rami fractures.   Original Report Authenticated By: Rudie Meyer, M.D.    EKG: Independently reviewed. Normal sinus rhythm at 68 beats per minute. RBBB and LAFB.  Q waves in the lateral leads.  Assessment/Plan Principal Problem:   Fall at home resulting in right sacral fractures, right superior and inferior pubic rami fracture and head trauma -Fall appears to be from mechanical factors related to her lower extremity weakness from spinal stenosis. -Admit for pain control. -Weight bearing as tolerated. -PT/OT consultations. Active Problems:   Lung nodule -No history of smoking, would not workup further given advanced age and unlikelihood that this represents malignancy.   Scalp laceration -Status post staples. Wound care per nursing staff.   HTN (hypertension) -Continue usual home medications including Norvasc, lisinopril, HCTZ and Tenormin.   Hyperlipidemia -Continue statin therapy.   Spinal stenosis / Neuropathy -PT/OT evaluations requested.   Osteoporosis -On bisphosphonate therapy at home. Pharmacy holds this per their protocol went in the hospital.   Hypokalemia -Will place on oral supplementation therapy given treatment with hydrochlorothiazide.    Leukocytosis -May be a stress response. No evidence of occult infection. Urinalysis clear.   Urinary frequency -Appears to be from overactive bladder. Urinalysis clear.  Code Status: Full. Family Communication: Ashlee [name] [phone]) sister-in-law at bedside. Disposition Plan: Likely will need SNF for rehab.  Time spent: 1 hour.  Ashlee [name] [facility] Pager [phone]  If 7PM-7AM, please contact night-coverage [email or web address] Password [facility] 10/02/2012, 12:45 PM

## 2012-10-02 NOTE — ED Notes (Signed)
Bed: XB14 Expected date:  Expected time:  Means of arrival:  Comments: ems- 79 yo F fall, laceration

## 2012-10-02 NOTE — Progress Notes (Signed)
Pt confirms pcp is Dr Waynard Edwards per family member at bedside EPIC updated

## 2012-10-02 NOTE — Progress Notes (Signed)
Late entry for 10/02/12 1715 Spoke with Dr Darnelle Catalan see new epic admission order

## 2012-10-02 NOTE — Progress Notes (Signed)
The choice and preference per pt and Ashlee [name] is for snf rehab prior to d./c home.    CM reviewed in details medicare guidelines, home health [facility]) (length of stay in home, types of [facility] staff available, coverage, primary caregiver, up to 24 hrs before services may be started),and Skilled nursing facilities (snf- coverage and services offered)   CM provided Ashlee [name] and pt with a list of [place] home health agencies  Discussed pt to be further evaluated by unit therapists (PT/OT) for recommendation of level of care and share this with attending MD and unit CM   HOME HEALTH AGENCIES SERVING [facility]   Agencies that are Medicare-Certified and are affiliated with The [facility] Home Health Agency  Telephone Number Address  [facility]   The [facility] has ownership interest in this company; however, you are under no obligation to use this agency. [phone] or  [phone] [address] [place], [place] [address] [email or web address]   Agencies that are Medicare-Certified and are not affiliated with [facility] Agency Telephone Number Address  [facility] [phone] Fax [phone] [address] [place], [place]  [address] [email or web address]  [facility] [phone] or [phone] Fax [phone] [address] [place], [place] [address] [email or web address]  [facility] [phone] Fax [phone] [address] [place], [place] [address] [email or web address]  [facility] [phone] Fax [phone] [address] [place], [place]  [address] [email or web address]  [facility] [phone] Fax [phone] [address] [place], [place]  [address] [email or web address]  [facility] [phone] [address] [place], [place] [address] [email or web address].dk  [facility] [phone]  [address] [place], [place] [address] [email or web address]  [facility] [phone] or [phone] Fax number [phone] [address] [place], [place]  [address] [email or web address]://[email or web address]  [facility] [phone] Fax [phone] [address] [place], [place]  [address]  [facility]  [phone] Fax [phone] [address] [place], [place] [address] [email or web address]://[email or web address]       Agencies that are not Medicare-Certified and are not affiliated with [facility] Agency Telephone Number Address  [facility] [place] [phone] or [phone] Fax [phone] [address] [place]  [address] [email or web address]  [facility] [phone] Fax [phone] [address] [place], [place]  [address] [email or web address]   [facility] [phone] Fax [phone] [address] [place], [place]  [address] [email or web address]://[email or web address]   [facility] [place]  [phone] Fax [phone] [address] [place], [place]  [address] [email or web address]   [facility]  [phone] Fax [phone] [address] [place], [place] [address] [email or web address]://[email or web address]  [facility] [phone] [address] [place], [place]  [address] [email or web address]   [facility], [place] [phone] Fax [phone] [address]  [place], [place]  [address] [email or web address]://[email or web address]    [facility] [phone] Fax [phone] [address] [place], [place] [address]  [facility] [phone] or [phone] Fax [phone] [address] [place], [place]  [address] [email or web address]  [facility] [phone]  [facility] [phone] Fax for both [phone] [address]  [place], [place] [address] [address]  [place]  [address] [email or web address]   [facility] [place] [phone] or [phone] Fax [phone] [address] [place] [place], [place]  [address] [email or web address]  [facility] [phone] Fax [phone] [address] [place], [place]  [address] [email or web address]  [facility] [phone] [address] [place], [place]  [address]  [facility] [phone] Fax [phone] [address] [place], [place]  [address]  [facility]  [phone] Fax [phone] [address] [place], [place]  [address] [email or web address]   [facility] [phone] Fax [phone] [address] [place], [place]  [address] [email or web address]   [facility]  [phone] Fax [phone]  [address] [place], [place] [address] [email or web address]  [facility] [phone] Fax [phone] [address] [place], [place]  [address] [email or web address]

## 2012-10-03 DIAGNOSIS — S3210XA Unspecified fracture of sacrum, initial encounter for closed fracture: Secondary | ICD-10-CM

## 2012-10-03 LAB — BASIC METABOLIC PANEL
BUN: 33 mg/dL — ABNORMAL HIGH (ref 6–23)
Calcium: 9.1 mg/dL (ref 8.4–10.5)
Creatinine, Ser: 0.91 mg/dL (ref 0.50–1.10)
GFR calc Af Amer: 60 mL/min — ABNORMAL LOW (ref >90)

## 2012-10-03 LAB — CBC
MCHC: 34.3 g/dL (ref 30.0–36.0)
MCV: 89.5 fL (ref 78.0–100.0)
Platelets: 142 10*3/uL — ABNORMAL LOW (ref 150–400)
RDW: 14 % (ref 11.5–15.5)
WBC: 10.2 10*3/uL (ref 4.0–10.5)

## 2012-10-03 MED ORDER — HYDROCODONE-ACETAMINOPHEN 5-325 MG PO TABS
1.0000 | ORAL_TABLET | ORAL | Status: DC | PRN
Start: 2012-10-03 — End: 2012-10-04

## 2012-10-03 MED ORDER — ACETAMINOPHEN 325 MG PO TABS: 650.0000 mg | ORAL_TABLET | Freq: Four times a day (QID) | ORAL | Status: DC | PRN

## 2012-10-03 NOTE — Progress Notes (Signed)
Clinical Social Work Department CLINICAL SOCIAL WORK PLACEMENT NOTE 10/03/2012  Patient:  Ashlee [name], Ashlee [name]  Account Number:  [id number] Admit date:  10/02/2012  Clinical Social Worker:  Cori Razor, LCSW  Date/time:  10/03/2012 12:37 PM  Clinical Social Work is seeking post-discharge placement for this patient at the following level of care:   SKILLED NURSING   (*CSW will update this form in Epic as items are completed)     Patient/family provided with [facility] Department of Clinical Social Work's list of facilities offering this level of care within the geographic area requested by the patient (or if unable, by the patient's family).  10/03/2012  Patient/family informed of their freedom to choose among providers that offer the needed level of care, that participate in Medicare, Medicaid or managed care program needed by the patient, have an available bed and are willing to accept the patient.    Patient/family informed of [facility]' ownership interest in [facility], as well as of the fact that they are under no obligation to receive care at this facility.  PASARR submitted to EDS on 10/02/2012 PASARR number received from EDS on 10/02/2012  FL2 transmitted to all facilities in geographic area requested by pt/family on  10/02/2012 FL2 transmitted to all facilities within larger geographic area on   Patient informed that his/her managed care company has contracts with or will negotiate with  certain facilities, including the following:     Patient/family informed of bed offers received:  10/03/2012 Patient chooses bed at [facility] Physician recommends and patient chooses bed at    Patient to be transferred to [facility] on  10/03/2012 Patient to be transferred to facility by [facility]  The following physician request were entered in Epic:   Additional Comments:  Cori Razor LCSW [phone]

## 2012-10-03 NOTE — Progress Notes (Signed)
10/02/12 1553  PT Time Calculation  PT Start Time 1509  PT Stop Time 1537  PT Time Calculation (min) 28 min  PT G-Codes **NOT FOR INPATIENT CLASS**  Functional Assessment Tool Used clinical judgement  Functional Limitation Mobility: Walking and moving around  Mobility: Walking and Moving Around Current Status (A2130) CK  Mobility: Walking and Moving Around Goal Status (Q6578) CJ  PT General Charges  $$ ACUTE PT VISIT 1 Procedure  PT Evaluation  $Initial PT Evaluation Tier I 1 Procedure  PT Treatments  $Therapeutic Activity 23-37 mins   Rebeca Alert, MPT [phone]

## 2012-10-03 NOTE — Progress Notes (Signed)
Clinical Social Work Department CLINICAL SOCIAL WORK PLACEMENT NOTE 10/03/2012  Patient:  [name],Ashlee P  Account Number:  [id number] Admit date:  10/02/2012  Clinical Social Worker:  Reiss Mowrey, LCSW  Date/time:  10/03/2012 12:37 PM  Clinical Social Work is seeking post-discharge placement for this patient at the following level of care:   SKILLED NURSING   (*CSW will update this form in Epic as items are completed)     Patient/family provided with [facility] Department of Clinical Social Work's list of facilities offering this level of care within the geographic area requested by the patient (or if unable, by the patient's family).  10/03/2012  Patient/family informed of their freedom to choose among providers that offer the needed level of care, that participate in Medicare, Medicaid or managed care program needed by the patient, have an available bed and are willing to accept the patient.    Patient/family informed of [facility]' ownership interest in [facility], as well as of the fact that they are under no obligation to receive care at this facility.  PASARR submitted to EDS on 10/02/2012 PASARR number received from EDS on 10/02/2012  FL2 transmitted to all facilities in geographic area requested by pt/family on  10/02/2012 FL2 transmitted to all facilities within larger geographic area on   Patient informed that his/her managed care company has contracts with or will negotiate with  certain facilities, including the following:     Patient/family informed of bed offers received:  10/03/2012 Patient chooses bed at [facility] Physician recommends and patient chooses bed at    Patient to be transferred to [facility] on  10/03/2012 Patient to be transferred to facility by [facility]  The following physician request were entered in Epic:   Additional Comments:  [name] LCSW [phone]  

## 2012-10-03 NOTE — Discharge Summary (Signed)
Physician Discharge Summary  YOMARIS PALECEK ZOX:[id number] DOB: August 16, 1914 DOA: 10/02/2012  PCP: Ezequiel Kayser, MD  Admit date: 10/02/2012 Discharge date: 10/03/2012  Time spent: 45 minutes  Recommendations for Outpatient Follow-up:  -To SNF for rehab following sacral fractures.   Discharge Diagnoses:  Principal Problem:   Fall at home resulting in right sacral fractures, right superior and inferior pubic rami fracture and head trauma Active Problems:   Lung nodule   Scalp laceration   HTN (hypertension)   Hyperlipidemia   Spinal stenosis   Neuropathy   Osteoporosis   Hypokalemia   Leukocytosis   Urinary frequency   Discharge Condition: Stable  Filed Weights   10/02/12 1405  Weight: 50.9 kg (112 lb 3.4 oz)    History of present illness:  Ashlee [name] is an 79 y.o. remarkably independent female with a PMH of HTN, spinal stenosis, osteoporosis and hyperlipidemia who lost her footing when coming down steps at home, fell, and struck her head on some bricks. Denies LOC. Fall was witnessed by family. Family was unable to help her to her feet and called 911. Upon initial evaluation in the ER, the patient was noted to have right sided sacral and superior and inferior pubic rami fractures along with a right occipital scalp laceration which was stapled. The patient states she has a long standing history of spinal stenosis, but that over the past several weeks, she has been bothered more by lower extremity "achiness" and difficulty with her gait, needing to rely on her walker more. No aggravating or alleviating factors. We were asked to admit her for further evaluation and management.   Hospital Course:   Sacral Fractures -PT/OT recs SNF. -weight-bearing as tolerated.  Scalp Laceration -Staples in. -CT Head without intracerebral bleeding.  Rest of chronic medical conditions have been stable this hospitalization.  Procedures:  None   Consultations:  None  Discharge  Instructions  Discharge Orders   Future Orders Complete By Expires   Diet - low sodium heart healthy  As directed    Discontinue IV  As directed    Increase activity slowly  As directed        Medication List         acetaminophen 325 MG tablet  Commonly known as:  TYLENOL  Take 2 tablets (650 mg total) by mouth every 6 (six) hours as needed.     amLODipine 5 MG tablet  Commonly known as:  NORVASC  Take 5 mg by mouth daily.     aspirin 81 MG chewable tablet  Chew 81 mg by mouth every morning.     atenolol 100 MG tablet  Commonly known as:  TENORMIN  Take 100 mg by mouth every morning.     benazepril-hydrochlorthiazide 20-12.5 MG per tablet  Commonly known as:  LOTENSIN HCT  Take 1 tablet by mouth daily.     HYDROcodone-acetaminophen 5-325 MG per tablet  Commonly known as:  NORCO/VICODIN  Take 1-2 tablets by mouth every 4 (four) hours as needed.     ibandronate 150 MG tablet  Commonly known as:  BONIVA  Take 150 mg by mouth every 30 (thirty) days. Take in the morning with a full glass of water, on an empty stomach, and do not take anything else by mouth or lie down for the next 30 min.     simvastatin 40 MG tablet  Commonly known as:  ZOCOR  Take 20 mg by mouth every evening.       Allergies  Allergen Reactions  . Penicillins       The results of significant diagnostics from this hospitalization (including imaging, microbiology, ancillary and laboratory) are listed below for reference.    Significant Diagnostic Studies: Dg Pelvis 1-2 Views  10/02/2012   *RADIOLOGY REPORT*  Clinical Data: History of fall.  PELVIS - 1-2 VIEW  Comparison: No priors.  Findings: A single AP view of the pelvis demonstrates no acute displaced fracture of the bony pelvic ring.  There is mild irregularity of the right inferior pubic ramus, which may represent an old healed fracture.  Bilateral proximal femurs as visualized appear intact, the femoral heads project over the acetabula  bilaterally.  A large calcification in the right hemipelvis likely represents a calcified fibroid.  IMPRESSION: 1.  No definite acute radiographic abnormality of the bony pelvis.   Original Report Authenticated By: Trudie Reed, M.D.   Dg Sacrum/coccyx  10/02/2012   *RADIOLOGY REPORT*  Clinical Data: History of fall.  Buttock pain.  Hip pain.  SACRUM AND COCCYX - 2+ VIEW  Comparison: Pelvis radiograph 10/02/2012.  Findings: Three views of the sacrum and coccyx are limited by overlying bowel contents on the frontal projections.  The lateral projection demonstrates no definite acute displaced fracture of the sacrum or coccyx.  Visualized portions of the bony pelvis otherwise appear intact, although there is mild irregularity of the inferior pubic ramus on the right, which may represent an old healed fracture.  Large coarse calcification in the right hemipelvis is most compatible with a calcified fibroid.  IMPRESSION: 1.  Slightly limited examination demonstrating no definite acute displaced fracture of the sacrum or coccyx.   Original Report Authenticated By: Trudie Reed, M.D.   Ct Head Wo Contrast  10/02/2012   *RADIOLOGY REPORT*  Clinical Data:  History of fall.  Laceration to the head.  CT HEAD WITHOUT CONTRAST CT CERVICAL SPINE WITHOUT CONTRAST  Technique:  Multidetector CT imaging of the head and cervical spine was performed following the standard protocol without intravenous contrast.  Multiplanar CT image reconstructions of the cervical spine were also generated.  Comparison:  Head CT 04/29/2011.  CT HEAD  Findings: There is some soft tissue swelling in the right occipital scalp.  Moderate cerebral and cerebellar atrophy.  Extensive patchy and confluent areas of decreased attenuation throughout the deep and periventricular white matter of the cerebral hemispheres bilaterally, compatible with chronic microvascular ischemic disease. No acute displaced skull fractures are identified.  No acute  intracranial abnormality.  Specifically, no evidence of acute post-traumatic intracranial hemorrhage, no definite regions of acute/subacute cerebral ischemia, no focal mass, mass effect, hydrocephalus or abnormal intra or extra-axial fluid collections. Visualized paranasal sinuses and mastoids are well pneumatized, with exception of a small low attenuation air fluid level in the right maxillary sinus.  IMPRESSION: 1.  Right occipital scalp hematoma/contusion, without underlying displaced skull fracture or signs of significant acute intracranial trauma. 2.  Moderate cerebral and cerebellar atrophy with extensive chronic microvascular ischemic changes throughout the cerebral white matter.  CT CERVICAL SPINE  Findings: No acute displaced fractures of the cervical spine. Prevertebral soft tissues are normal.  Multilevel degenerative disc disease, most severe at C4-C5, C5-C6 and C6-C7.  Multilevel facet arthropathy is also noted.  There is 4 mm of anterolisthesis of C6 upon C7, favored to be chronic.  Alignment is otherwise anatomic. Visualized portions of the upper thorax are remarkable for bilateral apical pleuroparenchymal thickening.  In addition, there is a 7 mm nodular-appearing pleural-based density in the posterior  right upper lobe (image 59 of series 6), as well as a 3 mm right upper lobe nodule (image 60 of series 6).  IMPRESSION: 1.  No evidence of significant acute traumatic injury to the cervical spine. 2.  Multilevel degenerative disc disease and cervical spondylosis, as above. 3.  7 mm pleural based nodular density in the posterior aspect of the right upper lobe.  This is favored to represent part of the pleuroparenchymal scarring which is seen throughout the remaining portions of the apices of the lungs bilaterally, however, this is slightly more nodular in appearance in the other regions, and may warrant attention on follow-up studies if clinically indicated. If the patient is at high risk for  bronchogenic carcinoma, follow-up chest CT at 3-6 months is recommended.  If the patient is at low risk for bronchogenic carcinoma, follow-up chest CT at 6-12 months is recommended.  This recommendation follows the consensus statement: Guidelines for Management of Small Pulmonary Nodules Detected on CT Scans: A Statement from the Fleischner Society as published in Radiology 2005; 237:395-400.   Original Report Authenticated By: Trudie Reed, M.D.   Ct Cervical Spine Wo Contrast  10/02/2012   *RADIOLOGY REPORT*  Clinical Data:  History of fall.  Laceration to the head.  CT HEAD WITHOUT CONTRAST CT CERVICAL SPINE WITHOUT CONTRAST  Technique:  Multidetector CT imaging of the head and cervical spine was performed following the standard protocol without intravenous contrast.  Multiplanar CT image reconstructions of the cervical spine were also generated.  Comparison:  Head CT 04/29/2011.  CT HEAD  Findings: There is some soft tissue swelling in the right occipital scalp.  Moderate cerebral and cerebellar atrophy.  Extensive patchy and confluent areas of decreased attenuation throughout the deep and periventricular white matter of the cerebral hemispheres bilaterally, compatible with chronic microvascular ischemic disease. No acute displaced skull fractures are identified.  No acute intracranial abnormality.  Specifically, no evidence of acute post-traumatic intracranial hemorrhage, no definite regions of acute/subacute cerebral ischemia, no focal mass, mass effect, hydrocephalus or abnormal intra or extra-axial fluid collections. Visualized paranasal sinuses and mastoids are well pneumatized, with exception of a small low attenuation air fluid level in the right maxillary sinus.  IMPRESSION: 1.  Right occipital scalp hematoma/contusion, without underlying displaced skull fracture or signs of significant acute intracranial trauma. 2.  Moderate cerebral and cerebellar atrophy with extensive chronic microvascular  ischemic changes throughout the cerebral white matter.  CT CERVICAL SPINE  Findings: No acute displaced fractures of the cervical spine. Prevertebral soft tissues are normal.  Multilevel degenerative disc disease, most severe at C4-C5, C5-C6 and C6-C7.  Multilevel facet arthropathy is also noted.  There is 4 mm of anterolisthesis of C6 upon C7, favored to be chronic.  Alignment is otherwise anatomic. Visualized portions of the upper thorax are remarkable for bilateral apical pleuroparenchymal thickening.  In addition, there is a 7 mm nodular-appearing pleural-based density in the posterior right upper lobe (image 59 of series 6), as well as a 3 mm right upper lobe nodule (image 60 of series 6).  IMPRESSION: 1.  No evidence of significant acute traumatic injury to the cervical spine. 2.  Multilevel degenerative disc disease and cervical spondylosis, as above. 3.  7 mm pleural based nodular density in the posterior aspect of the right upper lobe.  This is favored to represent part of the pleuroparenchymal scarring which is seen throughout the remaining portions of the apices of the lungs bilaterally, however, this is slightly more nodular in appearance  in the other regions, and may warrant attention on follow-up studies if clinically indicated. If the patient is at high risk for bronchogenic carcinoma, follow-up chest CT at 3-6 months is recommended.  If the patient is at low risk for bronchogenic carcinoma, follow-up chest CT at 6-12 months is recommended.  This recommendation follows the consensus statement: Guidelines for Management of Small Pulmonary Nodules Detected on CT Scans: A Statement from the Fleischner Society as published in Radiology 2005; 237:395-400.   Original Report Authenticated By: Trudie Reed, M.D.   Ct Pelvis Wo Contrast  10/02/2012   *RADIOLOGY REPORT*  Clinical Data: Larey Seat.  Right-sided pelvic pain.  CT PELVIS WITHOUT CONTRAST  Technique:  Multidetector CT imaging of the pelvis was  performed following the standard protocol without intravenous contrast.  Comparison: Radiographs 10/02/2012.  Findings: There are right-sided sacral fractures and right-sided superior and inferior pubic rami fractures.  No significant displacement.  Both hips are normally located.  No hip fracture. Moderate hip joint degenerative changes bilaterally and chondrocalcinosis.  Lower lumbar degenerative changes vertically involving the facet joints.  A large calcified uterine fibroid is noted on the right side.  No significant intrapelvic abnormalities.  Advanced atherosclerotic calcifications involving the aorta and iliac arteries.  IMPRESSION:  1.  Right-sided sacral fractures. 2.  Right-sided superior and inferior pubic rami fractures.   Original Report Authenticated By: Rudie Meyer, M.D.    Microbiology: No results found for this or any previous visit (from the past 240 hour(s)).   Labs: Basic Metabolic Panel:  Recent Labs Lab 10/02/12 1145 10/03/12 0415  NA 134* 136  K 3.3* 3.6  CL 99 103  CO2 24 27  GLUCOSE 112* 133*  BUN 34* 33*  CREATININE 0.93 0.91  CALCIUM 9.2 9.1   Liver Function Tests: No results found for this basename: AST, ALT, ALKPHOS, BILITOT, PROT, ALBUMIN,  in the last 168 hours No results found for this basename: LIPASE, AMYLASE,  in the last 168 hours No results found for this basename: AMMONIA,  in the last 168 hours CBC:  Recent Labs Lab 10/02/12 1145 10/03/12 0415  WBC 15.5* 10.2  NEUTROABS 13.0*  --   HGB 9.8* 9.1*  HCT 29.0* 26.5*  MCV 90.3 89.5  PLT 172 142*   Cardiac Enzymes: No results found for this basename: CKTOTAL, CKMB, CKMBINDEX, TROPONINI,  in the last 168 hours BNP: BNP (last 3 results) No results found for this basename: PROBNP,  in the last 8760 hours CBG: No results found for this basename: GLUCAP,  in the last 168 hours     Signed:  Chaya Jan  [facility] Pager: [phone] 10/03/2012, 9:28 AM

## 2012-10-03 NOTE — Progress Notes (Signed)
ED Cm spoke with Dr Waynard Edwards at [phone] who reports his RN is on maternity leave He indicated pt seen by [facility] in September 2012  1026 left Ashlee [name] a voice message [phone]) when unable to reach pt in room 1611 to updated her on the previously used home health agency and to inform pt that Dr Waynard Edwards wanted her to know he was sorry "this happened, she will need rehab and I hope to see her soon" Updated Unit CM

## 2012-10-03 NOTE — Progress Notes (Signed)
Clinical Social Work Department BRIEF PSYCHOSOCIAL ASSESSMENT 10/03/2012  Patient:  MARUA, QIN     Account Number:  [id number]     Admit date:  10/02/2012  Clinical Social Worker:  Candie Chroman  Date/Time:  10/03/2012 12:01 PM  Referred by:  Physician  Date Referred:  10/02/2012 Referred for  SNF Placement   Other Referral:   Interview type:  Family Other interview type:    PSYCHOSOCIAL DATA Living Status:  ALONE Admitted from facility:   Level of care:   Primary support name:  Jerrell Belfast Primary support relationship to patient:  FAMILY Degree of support available:   supportive    CURRENT CONCERNS Current Concerns  Post-Acute Placement   Other Concerns:    SOCIAL WORK ASSESSMENT / PLAN Pt is a 79 yr old female living at home prior to hospitalization. CSW spoke with pt's sister in-law to assist with d/c planning. PT has recommended SNF placement. Pt/family are in agreement with this plan. Pt has been to [facility] in the past and has requested placement there following hospital d/c. SNF contacted and bed offer received. Pt will be d/c to [facility] today.   Assessment/plan status:  Psychosocial Support/Ongoing Assessment of Needs Other assessment/ plan:   Information/referral to community resources:    PATIENT'S/FAMILY'S RESPONSE TO PLAN OF CARE: Pt is looking forward to rehab at [facility].   Cori Razor LCSW [phone]

## 2012-10-03 NOTE — Progress Notes (Deleted)
10/02/12 1553  OT G-codes **NOT FOR INPATIENT CLASS**  Functional Assessment Tool Used clinical observation/judgment  Functional Limitation Self care  Self Care Current Status (Z6109) CN  Self Care Goal Status (U0454) CL

## 2012-10-03 NOTE — Progress Notes (Signed)
10/02/12 1553  OT G-codes **NOT FOR INPATIENT CLASS**  Functional Assessment Tool Used clinical observation/judgment  Functional Limitation Self care  Self Care Current Status (V2536) CN  Self Care Goal Status (U4403) Christene Lye, OTR/L [phone] 10/03/2012

## 2012-10-03 NOTE — Progress Notes (Signed)
Attempted to give report to [facility] no ine answered the phone,  D Susann Givens RN

## 2012-10-03 NOTE — Care Management Note (Signed)
    Page 1 of 1   10/03/2012     1:29:58 PM   CARE MANAGEMENT NOTE 10/03/2012  Patient:  Ashlee [name], Ashlee [name]   Account Number:  [id number]  Date Initiated:  10/03/2012  Documentation initiated by:  Colleen Can  Subjective/Objective Assessment:   DX fall; rt scaral fxs-superior and inferior pubic rami fxs, head trauma with scalp laceration     Action/Plan:   Plan is for SNF rehab   Anticipated DC Date:  10/03/2012   Anticipated DC Plan:  SKILLED NURSING FACILITY  In-house referral  Clinical Social Worker      DC Planning Services  CM consult      Choice offered to / List presented to:             Status of service:  Completed, signed off Medicare Important Message given?   (If response is "NO", the following Medicare IM given date fields will be blank) Date Medicare IM given:   Date Additional Medicare IM given:    Discharge Disposition:    Per UR Regulation:    If discussed at Long Length of Stay Meetings, dates discussed:    Comments:

## 2012-10-04 ENCOUNTER — Emergency Department (HOSPITAL_COMMUNITY): Payer: Medicare Other

## 2012-10-04 ENCOUNTER — Encounter (HOSPITAL_COMMUNITY): Payer: Self-pay | Admitting: *Deleted

## 2012-10-04 ENCOUNTER — Observation Stay (HOSPITAL_COMMUNITY)
Admission: EM | Admit: 2012-10-04 | Discharge: 2012-10-05 | Disposition: A | Discharge status: Skilled Nursing Facility | Payer: Medicare Other | Attending: Internal Medicine | Admitting: Internal Medicine

## 2012-10-04 DIAGNOSIS — Z79899 Other long term (current) drug therapy: Secondary | ICD-10-CM | POA: Insufficient documentation

## 2012-10-04 DIAGNOSIS — I10 Essential (primary) hypertension: Secondary | ICD-10-CM

## 2012-10-04 DIAGNOSIS — W19XXXA Unspecified fall, initial encounter: Secondary | ICD-10-CM

## 2012-10-04 DIAGNOSIS — R296 Repeated falls: Secondary | ICD-10-CM

## 2012-10-04 DIAGNOSIS — Z85828 Personal history of other malignant neoplasm of skin: Secondary | ICD-10-CM | POA: Insufficient documentation

## 2012-10-04 DIAGNOSIS — Z9181 History of falling: Secondary | ICD-10-CM

## 2012-10-04 DIAGNOSIS — R911 Solitary pulmonary nodule: Secondary | ICD-10-CM

## 2012-10-04 DIAGNOSIS — Y9210 Unspecified residential institution as the place of occurrence of the external cause: Secondary | ICD-10-CM | POA: Insufficient documentation

## 2012-10-04 DIAGNOSIS — S8001XA Contusion of right knee, initial encounter: Secondary | ICD-10-CM

## 2012-10-04 DIAGNOSIS — R0902 Hypoxemia: Principal | ICD-10-CM

## 2012-10-04 DIAGNOSIS — E876 Hypokalemia: Secondary | ICD-10-CM

## 2012-10-04 DIAGNOSIS — W19XXXS Unspecified fall, sequela: Secondary | ICD-10-CM

## 2012-10-04 DIAGNOSIS — M4800 Spinal stenosis, site unspecified: Secondary | ICD-10-CM

## 2012-10-04 DIAGNOSIS — S0101XS Laceration without foreign body of scalp, sequela: Secondary | ICD-10-CM

## 2012-10-04 DIAGNOSIS — E785 Hyperlipidemia, unspecified: Secondary | ICD-10-CM

## 2012-10-04 DIAGNOSIS — M810 Age-related osteoporosis without current pathological fracture: Secondary | ICD-10-CM

## 2012-10-04 DIAGNOSIS — G629 Polyneuropathy, unspecified: Secondary | ICD-10-CM

## 2012-10-04 DIAGNOSIS — R42 Dizziness and giddiness: Secondary | ICD-10-CM | POA: Insufficient documentation

## 2012-10-04 DIAGNOSIS — D72829 Elevated white blood cell count, unspecified: Secondary | ICD-10-CM

## 2012-10-04 DIAGNOSIS — R350 Frequency of micturition: Secondary | ICD-10-CM

## 2012-10-04 DIAGNOSIS — Y92009 Unspecified place in unspecified non-institutional (private) residence as the place of occurrence of the external cause: Secondary | ICD-10-CM

## 2012-10-04 DIAGNOSIS — Z7982 Long term (current) use of aspirin: Secondary | ICD-10-CM | POA: Insufficient documentation

## 2012-10-04 LAB — BASIC METABOLIC PANEL
BUN: 38 mg/dL — ABNORMAL HIGH (ref 6–23)
CO2: 23 mEq/L (ref 19–32)
Calcium: 8.8 mg/dL (ref 8.4–10.5)
Creatinine, Ser: 0.85 mg/dL (ref 0.50–1.10)
GFR calc non Af Amer: 56 mL/min — ABNORMAL LOW (ref >90)
Glucose, Bld: 127 mg/dL — ABNORMAL HIGH (ref 70–99)
Sodium: 135 mEq/L (ref 135–145)

## 2012-10-04 LAB — BASIC METABOLIC PANEL WITH GFR
Chloride: 103 meq/L (ref 96–112)
GFR calc Af Amer: 65 mL/min — ABNORMAL LOW (ref >90)
Potassium: 3.6 meq/L (ref 3.5–5.1)

## 2012-10-04 LAB — CBC
HCT: 26.9 % — ABNORMAL LOW (ref 36.0–46.0)
Hemoglobin: 9.2 g/dL — ABNORMAL LOW (ref 12.0–15.0)
MCH: 30.5 pg (ref 26.0–34.0)
MCHC: 34.2 g/dL (ref 30.0–36.0)
MCV: 89.1 fL (ref 78.0–100.0)
Platelets: 139 K/uL — ABNORMAL LOW (ref 150–400)
RBC: 3.02 MIL/uL — ABNORMAL LOW (ref 3.87–5.11)
RDW: 14 % (ref 11.5–15.5)
WBC: 13.5 K/uL — ABNORMAL HIGH (ref 4.0–10.5)

## 2012-10-04 MED ORDER — AMLODIPINE BESYLATE 5 MG PO TABS
5.0000 mg | ORAL_TABLET | Freq: Every morning | ORAL | Status: DC
  Filled 2012-10-04 (×2): qty 1

## 2012-10-04 MED ORDER — ACETAMINOPHEN 325 MG PO TABS: 650.0000 mg | ORAL_TABLET | Freq: Four times a day (QID) | ORAL | Status: DC | PRN

## 2012-10-04 MED ORDER — ATENOLOL 100 MG PO TABS
100.0000 mg | ORAL_TABLET | Freq: Every morning | ORAL | Status: DC
  Filled 2012-10-04 (×2): qty 1

## 2012-10-04 MED ORDER — SODIUM CHLORIDE 0.9 % IJ SOLN: 3.0000 mL | Freq: Two times a day (BID) | INTRAMUSCULAR | Status: DC

## 2012-10-04 MED ORDER — SODIUM CHLORIDE 0.9 % IV BOLUS (SEPSIS)
500.0000 mL | Freq: Once | INTRAVENOUS | Status: AC
  Administered 2012-10-04: 500 mL via INTRAVENOUS

## 2012-10-04 MED ORDER — ACETAMINOPHEN 325 MG PO TABS
325.0000 mg | ORAL_TABLET | Freq: Once | ORAL | Status: AC
  Administered 2012-10-04: 325 mg via ORAL
  Filled 2012-10-04: qty 1

## 2012-10-04 MED ORDER — SODIUM CHLORIDE 0.9 % IV SOLN
Freq: Once | INTRAVENOUS | Status: AC
  Administered 2012-10-04: 09:00:00 via INTRAVENOUS

## 2012-10-04 MED ORDER — HYDROCODONE-ACETAMINOPHEN 5-325 MG PO TABS
1.0000 | ORAL_TABLET | Freq: Three times a day (TID) | ORAL | Status: DC
  Administered 2012-10-04 – 2012-10-05 (×3): 1 via ORAL
  Filled 2012-10-04 (×3): qty 1

## 2012-10-04 MED ORDER — SODIUM CHLORIDE 0.9 % IV SOLN
INTRAVENOUS | Status: AC
  Administered 2012-10-04: 12:00:00 via INTRAVENOUS

## 2012-10-04 MED ORDER — ALBUTEROL SULFATE (5 MG/ML) 0.5% IN NEBU
5.0000 mg | INHALATION_SOLUTION | Freq: Once | RESPIRATORY_TRACT | Status: AC
  Administered 2012-10-04: 5 mg via RESPIRATORY_TRACT
  Filled 2012-10-04: qty 1

## 2012-10-04 MED ORDER — ASPIRIN 81 MG PO CHEW
81.0000 mg | CHEWABLE_TABLET | Freq: Every morning | ORAL | Status: DC
  Administered 2012-10-04 – 2012-10-05 (×2): 81 mg via ORAL
  Filled 2012-10-04 (×2): qty 1

## 2012-10-04 MED ORDER — SIMVASTATIN 20 MG PO TABS
20.0000 mg | ORAL_TABLET | Freq: Every evening | ORAL | Status: DC
  Administered 2012-10-04: 20 mg via ORAL
  Filled 2012-10-04 (×2): qty 1

## 2012-10-04 MED ORDER — SODIUM CHLORIDE 0.9 % IJ SOLN: 3.0000 mL | INTRAMUSCULAR | Status: DC | PRN

## 2012-10-04 MED ORDER — SODIUM CHLORIDE 0.9 % IJ SOLN
3.0000 mL | Freq: Two times a day (BID) | INTRAMUSCULAR | Status: DC
  Administered 2012-10-04 – 2012-10-05 (×2): 3 mL via INTRAVENOUS

## 2012-10-04 MED ORDER — BENAZEPRIL-HYDROCHLOROTHIAZIDE 20-12.5 MG PO TABS: 1.0000 | ORAL_TABLET | Freq: Every morning | ORAL | Status: DC

## 2012-10-04 MED ORDER — BENAZEPRIL HCL 20 MG PO TABS
20.0000 mg | ORAL_TABLET | Freq: Every day | ORAL | Status: DC
  Filled 2012-10-04 (×2): qty 1

## 2012-10-04 MED ORDER — SENNOSIDES-DOCUSATE SODIUM 8.6-50 MG PO TABS
1.0000 | ORAL_TABLET | Freq: Every evening | ORAL | Status: DC | PRN
  Filled 2012-10-04: qty 1

## 2012-10-04 MED ORDER — ONDANSETRON HCL 4 MG PO TABS: 4.0000 mg | ORAL_TABLET | Freq: Four times a day (QID) | ORAL | Status: DC | PRN

## 2012-10-04 MED ORDER — SODIUM CHLORIDE 0.9 % IV SOLN: 250.0000 mL | INTRAVENOUS | Status: DC | PRN

## 2012-10-04 MED ORDER — HYDROCHLOROTHIAZIDE 12.5 MG PO CAPS
12.5000 mg | ORAL_CAPSULE | Freq: Every day | ORAL | Status: DC
  Filled 2012-10-04 (×2): qty 1

## 2012-10-04 MED ORDER — ALBUTEROL SULFATE (5 MG/ML) 0.5% IN NEBU
2.5000 mg | INHALATION_SOLUTION | Freq: Four times a day (QID) | RESPIRATORY_TRACT | Status: AC
  Administered 2012-10-04 (×2): 2.5 mg via RESPIRATORY_TRACT
  Filled 2012-10-04 (×2): qty 0.5

## 2012-10-04 MED ORDER — ONDANSETRON HCL 4 MG/2ML IJ SOLN: 4.0000 mg | Freq: Four times a day (QID) | INTRAMUSCULAR | Status: DC | PRN

## 2012-10-04 MED ORDER — ENOXAPARIN SODIUM 40 MG/0.4ML ~~LOC~~ SOLN
40.0000 mg | SUBCUTANEOUS | Status: DC
  Administered 2012-10-04: 40 mg via SUBCUTANEOUS
  Filled 2012-10-04 (×2): qty 0.4

## 2012-10-04 NOTE — ED Notes (Signed)
Bed: ZO10 Expected date: 10/04/12 Expected time: 6:42 AM Means of arrival: Ambulance Comments: 79 yo F  Fall

## 2012-10-04 NOTE — H&P (Signed)
[facility]          History and Physical    PCP:   Ezequiel Kayser, MD   Chief Complaint:  Fall, hypoxemia  HPI: Pleasant 79 y/o woman who was just discharged from the hospital to a SNF yesterday following a fall resulting in sacral fractures, returns to the hospital today after she was found on the floor this am at the SNF.  She never lost consciousness. She was brought to the ED for evaluation, where repeated xrays do not show evidence for more fractures, but she has been hypoxemic to 88% on RA. CXR negative, lab work is essentially unremarkable. We have been asked to admit her for observation.  Allergies:   Allergies  Allergen Reactions  . Penicillins Other (See Comments)    Reaction Unknown      Past Medical History  Diagnosis Date  . Hypertension   . High cholesterol   . Spinal stenosis   . Osteoporosis   . Neuropathy   . Squamous cell skin cancer   . Overactive bladder     Past Surgical History  Procedure Laterality Date  . Tonsillectomy      20's    Prior to Admission medications   Medication Sig Start Date End Date Taking? Authorizing Provider  acetaminophen (TYLENOL) 325 MG tablet Take 650 mg by mouth every 6 (six) hours as needed for pain or fever.   Yes Historical Provider, MD  alendronate (FOSAMAX) 70 MG tablet Take 70 mg by mouth every 7 (seven) days. Take with a full glass of water on an empty stomach every Wednesday.   Yes Historical Provider, MD  amLODipine (NORVASC) 5 MG tablet Take 5 mg by mouth every morning.    Yes Historical Provider, MD  aspirin 81 MG chewable tablet Chew 81 mg by mouth every morning.    Yes Historical Provider, MD  atenolol (TENORMIN) 100 MG tablet Take 100 mg by mouth every morning.    Yes Historical Provider, MD  benazepril-hydrochlorthiazide (LOTENSIN HCT) 20-12.5 MG per tablet Take 1 tablet by mouth every morning.    Yes Historical Provider, MD  HYDROcodone-acetaminophen (NORCO/VICODIN) 5-325 MG per tablet  Take 1 tablet by mouth every 8 (eight) hours. 0600, 1400, 2200   Yes Historical Provider, MD  lidocaine (LIDODERM) 5 % Place 1 patch onto the skin daily. Apply patch in the morning and remove patch at bedtime.   Yes Historical Provider, MD  simvastatin (ZOCOR) 40 MG tablet Take 20 mg by mouth every evening.   Yes Historical Provider, MD    Social History:  reports that she has never smoked. She does not have any smokeless tobacco history on file. She reports that  drinks alcohol. She reports that she does not use illicit drugs.  Family History  Problem Relation Age of Onset  . Heart failure Mother   . Diabetes Brother     Review of Systems:  Constitutional: Denies fever, chills, diaphoresis, appetite change and fatigue.  HEENT: Denies photophobia, eye pain, redness, hearing loss, ear pain, congestion, sore throat, rhinorrhea, sneezing, mouth sores, trouble swallowing, neck pain, neck stiffness and tinnitus.   Respiratory: Denies SOB, DOE, cough, chest tightness,  and wheezing.   Cardiovascular: Denies chest pain, palpitations and leg swelling.  Gastrointestinal: Denies nausea, vomiting, abdominal pain, diarrhea, constipation, blood in stool and abdominal distention.  Genitourinary: Denies dysuria, urgency, frequency, hematuria, flank pain and difficulty urinating.  Endocrine: Denies: hot or cold intolerance, sweats, changes in hair or nails, polyuria, polydipsia. Musculoskeletal:  Denies myalgias, back pain, joint swelling, arthralgias and gait problem.  Skin: Denies pallor, rash and wound.  Neurological: Denies dizziness, seizures, syncope, weakness, light-headedness, numbness and headaches.  Hematological: Denies adenopathy. Easy bruising, personal or family bleeding history  Psychiatric/Behavioral: Denies suicidal ideation, mood changes, confusion, nervousness, sleep disturbance and agitation   Physical Exam: Blood pressure 138/63, pulse 62, temperature 97.7 F (36.5 C), temperature  source Oral, resp. rate 18, SpO2 100.00%. Gen: alert, seems disoriented HEENT: [place]/AT/PERRL  Neck: supple, no JVD, no LAD, no bruits, no goiter. CV: RRR Lungs: CTA B Abd: S/NT/ND/+BS/no masses Ext: no C/C/E/+pedal pulses/large bruise over right knee. Neuro: non-focal. I have not ambulated her.  Labs on Admission:  Results for orders placed during the hospital encounter of 10/04/12 (from the past 48 hour(s))  CBC     Status: Abnormal   Collection Time    10/04/12  8:25 AM      Result Value Range   WBC 13.5 (*) 4.0 - 10.5 K/uL   RBC 3.02 (*) 3.87 - 5.11 MIL/uL   Hemoglobin 9.2 (*) 12.0 - 15.0 g/dL   HCT 16.1 (*) 09.6 - 04.5 %   MCV 89.1  78.0 - 100.0 fL   MCH 30.5  26.0 - 34.0 pg   MCHC 34.2  30.0 - 36.0 g/dL   RDW 40.9  81.1 - 91.4 %   Platelets 139 (*) 150 - 400 K/uL  BASIC METABOLIC PANEL     Status: Abnormal   Collection Time    10/04/12  8:25 AM      Result Value Range   Sodium 135  135 - 145 mEq/L   Potassium 3.6  3.5 - 5.1 mEq/L   Chloride 103  96 - 112 mEq/L   CO2 23  19 - 32 mEq/L   Glucose, Bld 127 (*) 70 - 99 mg/dL   BUN 38 (*) 6 - 23 mg/dL   Creatinine, Ser 7.82  0.50 - 1.10 mg/dL   Calcium 8.8  8.4 - 95.6 mg/dL   GFR calc non Af Amer 56 (*) >90 mL/min   GFR calc Af Amer 65 (*) >90 mL/min   Comment: (NOTE)     The eGFR has been calculated using the CKD EPI equation.     This calculation has not been validated in all clinical situations.     eGFR's persistently <90 mL/min signify possible Chronic Kidney     Disease.    Radiological Exams on Admission: Dg Chest 2 View  10/04/2012   *RADIOLOGY REPORT*  Clinical Data: 79 year old female with cough and hypoxia  CHEST - 2 VIEW  Comparison: 07/22/2007 chest radiograph  Findings: The cardiomediastinal silhouette is unremarkable. Peribronchial thickening is unchanged. There is no evidence of focal airspace disease, pulmonary edema, suspicious pulmonary nodule/mass, pleural effusion, or pneumothorax. No acute bony  abnormalities are identified.  IMPRESSION: No evidence of acute cardiopulmonary disease.   Original Report Authenticated By: Harmon Pier, M.D.   Dg Hip Complete Right  10/04/2012   *RADIOLOGY REPORT*  Clinical Data: Fall with right hip pain.  RIGHT HIP - COMPLETE 2+ VIEW  Comparison: 10/02/2012 radiograph  Findings: There is no evidence of acute fracture, subluxation or dislocation. Degenerative changes within both hips identified. Soft tissue swelling overlying the right hip is present. No focal bony lesions are present. A calcified uterine fibroid is identified.  IMPRESSION: No evidence of acute bony abnormality.  Degenerative changes within both hips.   Original Report Authenticated By: Harmon Pier, M.D.   Ct  Cervical Spine Wo Contrast  10/04/2012   *RADIOLOGY REPORT*  Clinical Data: Recurrent falls, trauma, pain  CT CERVICAL SPINE WITHOUT CONTRAST  Technique:  Multidetector CT imaging of the cervical spine was performed. Multiplanar CT image reconstructions were also generated.  Comparison: 10/02/2012  Findings: Bones are osteopenic.  Diffuse degenerative disc disease and spondylosis noted at all levels with anterior osteophytes. Marked diffuse facet arthropathy of the cervical spine.  Stable alignment.  Chronic minimal anterior listhesis of C6-C7 suspect related to degenerative change.  No compression fracture, wedge shaped deformity, subluxation or dislocation.  Facets aligned. Normal prevertebral soft tissues.  Intact odontoid.  Carotid calcifications present bilaterally.  Apical scarring present.  No soft tissue asymmetry in the neck.  IMPRESSION: Stable degenerative changes, spondylosis and facet arthropathy throughout the cervical spine.  No displaced fracture or malalignment.   Original Report Authenticated By: Judie Petit. Miles Costain, M.D.   Ct Pelvis Wo Contrast  10/02/2012   *RADIOLOGY REPORT*  Clinical Data: Larey Seat.  Right-sided pelvic pain.  CT PELVIS WITHOUT CONTRAST  Technique:  Multidetector CT imaging of  the pelvis was performed following the standard protocol without intravenous contrast.  Comparison: Radiographs 10/02/2012.  Findings: There are right-sided sacral fractures and right-sided superior and inferior pubic rami fractures.  No significant displacement.  Both hips are normally located.  No hip fracture. Moderate hip joint degenerative changes bilaterally and chondrocalcinosis.  Lower lumbar degenerative changes vertically involving the facet joints.  A large calcified uterine fibroid is noted on the right side.  No significant intrapelvic abnormalities.  Advanced atherosclerotic calcifications involving the aorta and iliac arteries.  IMPRESSION:  1.  Right-sided sacral fractures. 2.  Right-sided superior and inferior pubic rami fractures.   Original Report Authenticated By: Rudie Meyer, M.D.   Dg Knee Complete 4 Views Right  10/04/2012   *RADIOLOGY REPORT*  Clinical Data: Fall with right knee pain.  RIGHT KNEE - COMPLETE 4+ VIEW  Comparison: None  Findings: There is no evidence of acute fracture, subluxation or dislocation. Mild tricompartmental joint space narrowing is present. There is no evidence of joint effusion. Chondrocalcinosis is identified. Remote injury along the medial femoral condyle noted. No focal lesions are identified.  IMPRESSION: No evidence of acute abnormality.  Mild tricompartmental degenerative changes and chondrocalcinosis/CPPD.   Original Report Authenticated By: Harmon Pier, M.D.    Assessment/Plan Principal Problem:   Fall Active Problems:   Hypoxemia   Fall -Mechanical in nature. -To return to SNF upon DC to continue PT.  Hypoxemia -Cause unclear. -CXR without infiltrate or edema. -?early bronchitis vs aspiration. -Will monitor in the hospital overnight, attempt to wean oxygen. -If remains hypoxemic, will need to consider repeat CXR vs CT scan chest to r/o PE. -No h/o Tobacco or smoking.  Time Spent on Admission: 70 minutes  HERNANDEZ  ACOSTA,ESTELA [facility] Pager: [phone] 10/04/2012, 10:56 AM

## 2012-10-04 NOTE — ED Notes (Signed)
Per ems pt from [facility]. [facility] staff called d/t pt with fall. Fall was non witnessed.  Pt denies any distress. Pt fell last week at home and was sent to [facility] for rehab. Pt has some swelling noted to right knee and bruise but unsure when this occurred.

## 2012-10-04 NOTE — ED Notes (Signed)
Removed pt O2 per Dr Oletta Lamas. Pt O2 dropped to 88%. Placed pt back on 2 L. Pt now 100%. Dr Oletta Lamas notified

## 2012-10-04 NOTE — ED Notes (Signed)
Room assignment changed to 1307

## 2012-10-04 NOTE — ED Provider Notes (Signed)
CSN: [id number]     Arrival date & time 10/04/12  4098 History   First MD Initiated Contact with Patient 10/04/12 [phone]     Chief Complaint  Patient presents with  . Fall   (Consider location/radiation/quality/duration/timing/severity/associated sxs/prior Treatment) HPI Comments: Pt had fallen a few days ago and was admitted briefly after suffering rami fractures, sacral fractures, and a scalp injury.  She was released to [facility] yesterday afternoon and this AM, was found on the floor, unwitnessed fall.  Pt reports she doesn't walk normally, reports feeling dizzy all day yesterday.  No CP, SOB.  No fevers, chills, sweats.  She reports her neck hurts a little worse, also has a bruise to right knee and pain again around hips especially on right side.  No back pain.  She was put on O2 initilally due to reported initial O2 sats of 88%.  Pt is not normally on O2, was independent at home until her fall 2 days ago.  Pt has a h/o dementia, level 5 caveat  Patient is a 79 y.o. female presenting with fall. The history is provided by the patient, medical records, a relative and the EMS personnel.  Fall This is a recurrent problem. The current episode started 3 to 5 hours ago. The problem occurs every several days. Pertinent negatives include no chest pain, no abdominal pain, no headaches and no shortness of breath.    Past Medical History  Diagnosis Date  . Hypertension   . High cholesterol   . Spinal stenosis   . Osteoporosis   . Neuropathy   . Squamous cell skin cancer   . Overactive bladder    Past Surgical History  Procedure Laterality Date  . Tonsillectomy      20's   Family History  Problem Relation Age of Onset  . Heart failure Mother   . Diabetes Brother    History  Substance Use Topics  . Smoking status: Never Smoker   . Smokeless tobacco: Not on file  . Alcohol Use: Yes     Comment: Rare wine   OB History   Grav Para Term Preterm Abortions TAB SAB Ect Mult Living                  Review of Systems  Unable to perform ROS: Dementia  Respiratory: Negative for shortness of breath.   Cardiovascular: Negative for chest pain.  Gastrointestinal: Negative for abdominal pain.  Neurological: Negative for headaches.    Allergies  Penicillins  Home Medications   Current Outpatient Rx  Name  Route  Sig  Dispense  Refill  . acetaminophen (TYLENOL) 325 MG tablet   Oral   Take 650 mg by mouth every 6 (six) hours as needed for pain or fever.         Marland Kitchen alendronate (FOSAMAX) 70 MG tablet   Oral   Take 70 mg by mouth every 7 (seven) days. Take with a full glass of water on an empty stomach every Wednesday.         Marland Kitchen amLODipine (NORVASC) 5 MG tablet   Oral   Take 5 mg by mouth every morning.          Marland Kitchen aspirin 81 MG chewable tablet   Oral   Chew 81 mg by mouth every morning.          Marland Kitchen atenolol (TENORMIN) 100 MG tablet   Oral   Take 100 mg by mouth every morning.          Marland Kitchen  benazepril-hydrochlorthiazide (LOTENSIN HCT) 20-12.5 MG per tablet   Oral   Take 1 tablet by mouth every morning.          Marland Kitchen HYDROcodone-acetaminophen (NORCO/VICODIN) 5-325 MG per tablet   Oral   Take 1 tablet by mouth every 8 (eight) hours. 0600, 1400, 2200         . lidocaine (LIDODERM) 5 %   Transdermal   Place 1 patch onto the skin daily. Apply patch in the morning and remove patch at bedtime.         . simvastatin (ZOCOR) 40 MG tablet   Oral   Take 20 mg by mouth every evening.          BP 106/51  Pulse 68  Temp(Src) 97.7 F (36.5 C) (Oral)  Resp 18  SpO2 100% Physical Exam  Nursing note and vitals reviewed. Constitutional: She appears well-developed and well-nourished. No distress.  HENT:  Head: Normocephalic and atraumatic.  Eyes: Conjunctivae are normal. No scleral icterus.  Neck: Trachea normal. Neck supple. No spinous process tenderness and no muscular tenderness present. No rigidity. Decreased range of motion present.  Cardiovascular:  Normal rate, regular rhythm and intact distal pulses.   Pulmonary/Chest: Effort normal. No accessory muscle usage. Not tachypneic. She has wheezes. She exhibits no tenderness and no crepitus.  Abdominal: Soft. Normal appearance. There is no tenderness.  Musculoskeletal:       Right shoulder: She exhibits normal range of motion.       Left shoulder: She exhibits normal range of motion.       Right elbow: She exhibits normal range of motion.       Left elbow: She exhibits normal range of motion.       Left wrist: She exhibits no tenderness.       Right hip: She exhibits tenderness. She exhibits no deformity.       Left hip: She exhibits normal range of motion and no tenderness.       Right knee: She exhibits decreased range of motion, swelling and ecchymosis. She exhibits no deformity. Tenderness found.       Left knee: She exhibits no deformity. No tenderness found.       Cervical back: She exhibits decreased range of motion and pain. She exhibits no tenderness, no deformity and no laceration.       Thoracic back: She exhibits no tenderness.       Lumbar back: She exhibits no tenderness and no pain.       Right forearm: She exhibits no tenderness.       Left forearm: She exhibits no tenderness.  Skin: She is not diaphoretic.    ED Course  Procedures (including critical care time) Labs Review Labs Reviewed  CBC - Abnormal; Notable for the following:    WBC 13.5 (*)    RBC 3.02 (*)    Hemoglobin 9.2 (*)    HCT 26.9 (*)    Platelets 139 (*)    All other components within normal limits  BASIC METABOLIC PANEL - Abnormal; Notable for the following:    Glucose, Bld 127 (*)    BUN 38 (*)    GFR calc non Af Amer 56 (*)    GFR calc Af Amer 65 (*)    All other components within normal limits   Imaging Review Dg Chest 2 View  10/04/2012   *RADIOLOGY REPORT*  Clinical Data: 79 year old female with cough and hypoxia  CHEST - 2 VIEW  Comparison: 07/22/2007  chest radiograph  Findings: The  cardiomediastinal silhouette is unremarkable. Peribronchial thickening is unchanged. There is no evidence of focal airspace disease, pulmonary edema, suspicious pulmonary nodule/mass, pleural effusion, or pneumothorax. No acute bony abnormalities are identified.  IMPRESSION: No evidence of acute cardiopulmonary disease.   Original Report Authenticated By: Harmon Pier, M.D.   Dg Pelvis 1-2 Views  10/02/2012   *RADIOLOGY REPORT*  Clinical Data: History of fall.  PELVIS - 1-2 VIEW  Comparison: No priors.  Findings: A single AP view of the pelvis demonstrates no acute displaced fracture of the bony pelvic ring.  There is mild irregularity of the right inferior pubic ramus, which may represent an old healed fracture.  Bilateral proximal femurs as visualized appear intact, the femoral heads project over the acetabula bilaterally.  A large calcification in the right hemipelvis likely represents a calcified fibroid.  IMPRESSION: 1.  No definite acute radiographic abnormality of the bony pelvis.   Original Report Authenticated By: Trudie Reed, M.D.   Dg Sacrum/coccyx  10/02/2012   *RADIOLOGY REPORT*  Clinical Data: History of fall.  Buttock pain.  Hip pain.  SACRUM AND COCCYX - 2+ VIEW  Comparison: Pelvis radiograph 10/02/2012.  Findings: Three views of the sacrum and coccyx are limited by overlying bowel contents on the frontal projections.  The lateral projection demonstrates no definite acute displaced fracture of the sacrum or coccyx.  Visualized portions of the bony pelvis otherwise appear intact, although there is mild irregularity of the inferior pubic ramus on the right, which may represent an old healed fracture.  Large coarse calcification in the right hemipelvis is most compatible with a calcified fibroid.  IMPRESSION: 1.  Slightly limited examination demonstrating no definite acute displaced fracture of the sacrum or coccyx.   Original Report Authenticated By: Trudie Reed, M.D.   Dg Hip Complete  Right  10/04/2012   *RADIOLOGY REPORT*  Clinical Data: Fall with right hip pain.  RIGHT HIP - COMPLETE 2+ VIEW  Comparison: 10/02/2012 radiograph  Findings: There is no evidence of acute fracture, subluxation or dislocation. Degenerative changes within both hips identified. Soft tissue swelling overlying the right hip is present. No focal bony lesions are present. A calcified uterine fibroid is identified.  IMPRESSION: No evidence of acute bony abnormality.  Degenerative changes within both hips.   Original Report Authenticated By: Harmon Pier, M.D.   Ct Head Wo Contrast  10/02/2012   *RADIOLOGY REPORT*  Clinical Data:  History of fall.  Laceration to the head.  CT HEAD WITHOUT CONTRAST CT CERVICAL SPINE WITHOUT CONTRAST  Technique:  Multidetector CT imaging of the head and cervical spine was performed following the standard protocol without intravenous contrast.  Multiplanar CT image reconstructions of the cervical spine were also generated.  Comparison:  Head CT 04/29/2011.  CT HEAD  Findings: There is some soft tissue swelling in the right occipital scalp.  Moderate cerebral and cerebellar atrophy.  Extensive patchy and confluent areas of decreased attenuation throughout the deep and periventricular white matter of the cerebral hemispheres bilaterally, compatible with chronic microvascular ischemic disease. No acute displaced skull fractures are identified.  No acute intracranial abnormality.  Specifically, no evidence of acute post-traumatic intracranial hemorrhage, no definite regions of acute/subacute cerebral ischemia, no focal mass, mass effect, hydrocephalus or abnormal intra or extra-axial fluid collections. Visualized paranasal sinuses and mastoids are well pneumatized, with exception of a small low attenuation air fluid level in the right maxillary sinus.  IMPRESSION: 1.  Right occipital scalp hematoma/contusion, without underlying displaced  skull fracture or signs of significant acute intracranial  trauma. 2.  Moderate cerebral and cerebellar atrophy with extensive chronic microvascular ischemic changes throughout the cerebral white matter.  CT CERVICAL SPINE  Findings: No acute displaced fractures of the cervical spine. Prevertebral soft tissues are normal.  Multilevel degenerative disc disease, most severe at C4-C5, C5-C6 and C6-C7.  Multilevel facet arthropathy is also noted.  There is 4 mm of anterolisthesis of C6 upon C7, favored to be chronic.  Alignment is otherwise anatomic. Visualized portions of the upper thorax are remarkable for bilateral apical pleuroparenchymal thickening.  In addition, there is a 7 mm nodular-appearing pleural-based density in the posterior right upper lobe (image 59 of series 6), as well as a 3 mm right upper lobe nodule (image 60 of series 6).  IMPRESSION: 1.  No evidence of significant acute traumatic injury to the cervical spine. 2.  Multilevel degenerative disc disease and cervical spondylosis, as above. 3.  7 mm pleural based nodular density in the posterior aspect of the right upper lobe.  This is favored to represent part of the pleuroparenchymal scarring which is seen throughout the remaining portions of the apices of the lungs bilaterally, however, this is slightly more nodular in appearance in the other regions, and may warrant attention on follow-up studies if clinically indicated. If the patient is at high risk for bronchogenic carcinoma, follow-up chest CT at 3-6 months is recommended.  If the patient is at low risk for bronchogenic carcinoma, follow-up chest CT at 6-12 months is recommended.  This recommendation follows the consensus statement: Guidelines for Management of Small Pulmonary Nodules Detected on CT Scans: A Statement from the Fleischner Society as published in Radiology 2005; 237:395-400.   Original Report Authenticated By: Trudie Reed, M.D.   Ct Cervical Spine Wo Contrast  10/04/2012   *RADIOLOGY REPORT*  Clinical Data: Recurrent falls,  trauma, pain  CT CERVICAL SPINE WITHOUT CONTRAST  Technique:  Multidetector CT imaging of the cervical spine was performed. Multiplanar CT image reconstructions were also generated.  Comparison: 10/02/2012  Findings: Bones are osteopenic.  Diffuse degenerative disc disease and spondylosis noted at all levels with anterior osteophytes. Marked diffuse facet arthropathy of the cervical spine.  Stable alignment.  Chronic minimal anterior listhesis of C6-C7 suspect related to degenerative change.  No compression fracture, wedge shaped deformity, subluxation or dislocation.  Facets aligned. Normal prevertebral soft tissues.  Intact odontoid.  Carotid calcifications present bilaterally.  Apical scarring present.  No soft tissue asymmetry in the neck.  IMPRESSION: Stable degenerative changes, spondylosis and facet arthropathy throughout the cervical spine.  No displaced fracture or malalignment.   Original Report Authenticated By: Judie Petit. Miles Costain, M.D.   Ct Cervical Spine Wo Contrast  10/02/2012   *RADIOLOGY REPORT*  Clinical Data:  History of fall.  Laceration to the head.  CT HEAD WITHOUT CONTRAST CT CERVICAL SPINE WITHOUT CONTRAST  Technique:  Multidetector CT imaging of the head and cervical spine was performed following the standard protocol without intravenous contrast.  Multiplanar CT image reconstructions of the cervical spine were also generated.  Comparison:  Head CT 04/29/2011.  CT HEAD  Findings: There is some soft tissue swelling in the right occipital scalp.  Moderate cerebral and cerebellar atrophy.  Extensive patchy and confluent areas of decreased attenuation throughout the deep and periventricular white matter of the cerebral hemispheres bilaterally, compatible with chronic microvascular ischemic disease. No acute displaced skull fractures are identified.  No acute intracranial abnormality.  Specifically, no evidence of acute post-traumatic  intracranial hemorrhage, no definite regions of acute/subacute  cerebral ischemia, no focal mass, mass effect, hydrocephalus or abnormal intra or extra-axial fluid collections. Visualized paranasal sinuses and mastoids are well pneumatized, with exception of a small low attenuation air fluid level in the right maxillary sinus.  IMPRESSION: 1.  Right occipital scalp hematoma/contusion, without underlying displaced skull fracture or signs of significant acute intracranial trauma. 2.  Moderate cerebral and cerebellar atrophy with extensive chronic microvascular ischemic changes throughout the cerebral white matter.  CT CERVICAL SPINE  Findings: No acute displaced fractures of the cervical spine. Prevertebral soft tissues are normal.  Multilevel degenerative disc disease, most severe at C4-C5, C5-C6 and C6-C7.  Multilevel facet arthropathy is also noted.  There is 4 mm of anterolisthesis of C6 upon C7, favored to be chronic.  Alignment is otherwise anatomic. Visualized portions of the upper thorax are remarkable for bilateral apical pleuroparenchymal thickening.  In addition, there is a 7 mm nodular-appearing pleural-based density in the posterior right upper lobe (image 59 of series 6), as well as a 3 mm right upper lobe nodule (image 60 of series 6).  IMPRESSION: 1.  No evidence of significant acute traumatic injury to the cervical spine. 2.  Multilevel degenerative disc disease and cervical spondylosis, as above. 3.  7 mm pleural based nodular density in the posterior aspect of the right upper lobe.  This is favored to represent part of the pleuroparenchymal scarring which is seen throughout the remaining portions of the apices of the lungs bilaterally, however, this is slightly more nodular in appearance in the other regions, and may warrant attention on follow-up studies if clinically indicated. If the patient is at high risk for bronchogenic carcinoma, follow-up chest CT at 3-6 months is recommended.  If the patient is at low risk for bronchogenic carcinoma, follow-up chest CT  at 6-12 months is recommended.  This recommendation follows the consensus statement: Guidelines for Management of Small Pulmonary Nodules Detected on CT Scans: A Statement from the Fleischner Society as published in Radiology 2005; 237:395-400.   Original Report Authenticated By: Trudie Reed, M.D.   Ct Pelvis Wo Contrast  10/02/2012   *RADIOLOGY REPORT*  Clinical Data: Larey Seat.  Right-sided pelvic pain.  CT PELVIS WITHOUT CONTRAST  Technique:  Multidetector CT imaging of the pelvis was performed following the standard protocol without intravenous contrast.  Comparison: Radiographs 10/02/2012.  Findings: There are right-sided sacral fractures and right-sided superior and inferior pubic rami fractures.  No significant displacement.  Both hips are normally located.  No hip fracture. Moderate hip joint degenerative changes bilaterally and chondrocalcinosis.  Lower lumbar degenerative changes vertically involving the facet joints.  A large calcified uterine fibroid is noted on the right side.  No significant intrapelvic abnormalities.  Advanced atherosclerotic calcifications involving the aorta and iliac arteries.  IMPRESSION:  1.  Right-sided sacral fractures. 2.  Right-sided superior and inferior pubic rami fractures.   Original Report Authenticated By: Rudie Meyer, M.D.   Dg Knee Complete 4 Views Right  10/04/2012   *RADIOLOGY REPORT*  Clinical Data: Fall with right knee pain.  RIGHT KNEE - COMPLETE 4+ VIEW  Comparison: None  Findings: There is no evidence of acute fracture, subluxation or dislocation. Mild tricompartmental joint space narrowing is present. There is no evidence of joint effusion. Chondrocalcinosis is identified. Remote injury along the medial femoral condyle noted. No focal lesions are identified.  IMPRESSION: No evidence of acute abnormality.  Mild tricompartmental degenerative changes and chondrocalcinosis/CPPD.   Original Report Authenticated By:  Harmon Pier, M.D.      Sat on 2L [place] is  95% and I interpret to be adequate.  Will attempt repeat spot check on RA and reassess since pt is not normally on O2.     8:19 AM Pt's sats again drop to 88%.  Pt with rhonchi, congestion noted, will give albuterol neb and obtain CXR.  May require re-admission.     9:20 AM BUN is now more elevated as well at 38 with normal Cr.  Will give IVF bolus as well for mild dehydration.  Will call for admission, likely observation.  MDM   1. Recurrent falls   2. Knee contusion, right, initial encounter   3. Hypoxia      Pt with recurrent fall.  I reviewed prior labs.  Pt was found to be anemic at 9.8 on 8/28, then 9.1 on day of discharge yesterday.  Will recheck.  Pt denies CP.  I think given new surroundings, possibly pt was disoriented and tried to get up on her own, fell onto her right knee.  Unknown if she injured head or neck again.  No obv injuries.  Will obtain repeat CT of c spein, plain films of hip, pelvis, knee on right.      Gavin Pound. Oletta Lamas, MD 10/04/12 [id number]

## 2012-10-04 NOTE — ED Notes (Signed)
Pt room assignment changed to 4th floor. Report given to [facility]

## 2012-10-05 ENCOUNTER — Observation Stay (HOSPITAL_COMMUNITY): Payer: Medicare Other

## 2012-10-05 LAB — BASIC METABOLIC PANEL
BUN: 38 mg/dL — ABNORMAL HIGH (ref 6–23)
CO2: 22 mEq/L (ref 19–32)
Chloride: 109 mEq/L (ref 96–112)
GFR calc Af Amer: 65 mL/min — ABNORMAL LOW (ref >90)
Potassium: 3.2 mEq/L — ABNORMAL LOW (ref 3.5–5.1)

## 2012-10-05 LAB — CBC
HCT: 22.4 % — ABNORMAL LOW (ref 36.0–46.0)
Hemoglobin: 7.5 g/dL — ABNORMAL LOW (ref 12.0–15.0)
MCV: 91.1 fL (ref 78.0–100.0)
WBC: 9.2 10*3/uL (ref 4.0–10.5)

## 2012-10-05 NOTE — Progress Notes (Signed)
Report called to Sherry Ruffing, Charity fundraiser at [facility].

## 2012-10-05 NOTE — Progress Notes (Signed)
Resident of [facility]- admitted to facility on 10/03/12 and returned to hospital on 10/04/12 due to dizziness and a fall.  She is stable per MD for d/c today back to facility- observation status. Notified Siri Cole- Database administrator at [facility] [employer] and also spoke to [employer]- Admissions at [facility]. Ok for return today. Patient and her sister are pleased with d/c plan. Nursing is also aware and will call report. CSW will sign off.  Lorri Frederick. [place]  [phone]  (weekend coverage)

## 2012-10-05 NOTE — Discharge Summary (Signed)
Physician Discharge Summary  Ashlee [name] ZOX:[id number] DOB: [birth year] DOA: 10/04/2012  PCP: Ezequiel Kayser, MD  Admit date: 10/04/2012 Discharge date: 10/05/2012  Time spent: 45 minutes  Recommendations for Outpatient Follow-up:  -back to SNF today. -We have cut down on her BP meds to see if that may have had a causal effect on her dizziness. Please monitor BP and further adjust as needed.   Discharge Diagnoses:  Principal Problem:   Fall Active Problems:   Hypoxemia   Discharge Condition: Stable and improved.  Filed Weights   10/04/12 1138  Weight: 51.8 kg (114 lb 3.2 oz)    History of present illness:  Patient is a pleasant 79 y/o woman who was just discharged from the hospital to a SNF yesterday following a fall resulting in sacral fractures, returns to the hospital today after she was found on the floor this am at the SNF. She never lost consciousness. She was brought to the ED for evaluation, where repeated xrays do not show evidence for more fractures, but she has been hypoxemic to 88% on RA. CXR negative, lab work is essentially unremarkable. We were asked to admit her for observation.   Hospital Course:   Fall -To return to SNF for continued rehab. -She had complained of dizziness, so unsure if this could have played a role in her falls. Plan to decrease BP meds. See below for details.  Hypoxemia -Resolved, transient. -Etiology remains unclear. -CXR negative.  HTN -She complained of dizziness on admission. -Even with holding her BP meds, her BP has been in the 100s-110s. -I will continue her norvasc, but will DC atenolol, HCTZ, ACE-I for now. -May need to be adjusted depending on BP fluctuations.  Rest of chronic medical issues have been stable this hospitalization.   Procedures:  None   Consultations:  None  Discharge Instructions  Discharge Orders   Future Orders Complete By Expires   Diet - low sodium heart healthy  As directed     Discontinue IV  As directed    Increase activity slowly  As directed        Medication List    STOP taking these medications       atenolol 100 MG tablet  Commonly known as:  TENORMIN     benazepril-hydrochlorthiazide 20-12.5 MG per tablet  Commonly known as:  LOTENSIN HCT      TAKE these medications       acetaminophen 325 MG tablet  Commonly known as:  TYLENOL  Take 650 mg by mouth every 6 (six) hours as needed for pain or fever.     alendronate 70 MG tablet  Commonly known as:  FOSAMAX  Take 70 mg by mouth every 7 (seven) days. Take with a full glass of water on an empty stomach every Wednesday.     amLODipine 5 MG tablet  Commonly known as:  NORVASC  Take 5 mg by mouth every morning.     aspirin 81 MG chewable tablet  Chew 81 mg by mouth every morning.     HYDROcodone-acetaminophen 5-325 MG per tablet  Commonly known as:  NORCO/VICODIN  Take 1 tablet by mouth every 8 (eight) hours. 0600, 1400, 2200     lidocaine 5 %  Commonly known as:  LIDODERM  Place 1 patch onto the skin daily. Apply patch in the morning and remove patch at bedtime.     simvastatin 40 MG tablet  Commonly known as:  ZOCOR  Take 20 mg by mouth every  evening.       Allergies  Allergen Reactions  . Penicillins Other (See Comments)    Reaction Unknown      The results of significant diagnostics from this hospitalization (including imaging, microbiology, ancillary and laboratory) are listed below for reference.    Significant Diagnostic Studies: Dg Chest 2 View  10/04/2012   *RADIOLOGY REPORT*  Clinical Data: 79 year old female with cough and hypoxia  CHEST - 2 VIEW  Comparison: 07/22/2007 chest radiograph  Findings: The cardiomediastinal silhouette is unremarkable. Peribronchial thickening is unchanged. There is no evidence of focal airspace disease, pulmonary edema, suspicious pulmonary nodule/mass, pleural effusion, or pneumothorax. No acute bony abnormalities are identified.   IMPRESSION: No evidence of acute cardiopulmonary disease.   Original Report Authenticated By: Harmon Pier, M.D.   Dg Pelvis 1-2 Views  10/02/2012   *RADIOLOGY REPORT*  Clinical Data: History of fall.  PELVIS - 1-2 VIEW  Comparison: No priors.  Findings: A single AP view of the pelvis demonstrates no acute displaced fracture of the bony pelvic ring.  There is mild irregularity of the right inferior pubic ramus, which may represent an old healed fracture.  Bilateral proximal femurs as visualized appear intact, the femoral heads project over the acetabula bilaterally.  A large calcification in the right hemipelvis likely represents a calcified fibroid.  IMPRESSION: 1.  No definite acute radiographic abnormality of the bony pelvis.   Original Report Authenticated By: Trudie Reed, M.D.   Dg Sacrum/coccyx  10/02/2012   *RADIOLOGY REPORT*  Clinical Data: History of fall.  Buttock pain.  Hip pain.  SACRUM AND COCCYX - 2+ VIEW  Comparison: Pelvis radiograph 10/02/2012.  Findings: Three views of the sacrum and coccyx are limited by overlying bowel contents on the frontal projections.  The lateral projection demonstrates no definite acute displaced fracture of the sacrum or coccyx.  Visualized portions of the bony pelvis otherwise appear intact, although there is mild irregularity of the inferior pubic ramus on the right, which may represent an old healed fracture.  Large coarse calcification in the right hemipelvis is most compatible with a calcified fibroid.  IMPRESSION: 1.  Slightly limited examination demonstrating no definite acute displaced fracture of the sacrum or coccyx.   Original Report Authenticated By: Trudie Reed, M.D.   Dg Hip Complete Right  10/04/2012   *RADIOLOGY REPORT*  Clinical Data: Fall with right hip pain.  RIGHT HIP - COMPLETE 2+ VIEW  Comparison: 10/02/2012 radiograph  Findings: There is no evidence of acute fracture, subluxation or dislocation. Degenerative changes within both hips  identified. Soft tissue swelling overlying the right hip is present. No focal bony lesions are present. A calcified uterine fibroid is identified.  IMPRESSION: No evidence of acute bony abnormality.  Degenerative changes within both hips.   Original Report Authenticated By: Harmon Pier, M.D.   Ct Head Wo Contrast  10/02/2012   *RADIOLOGY REPORT*  Clinical Data:  History of fall.  Laceration to the head.  CT HEAD WITHOUT CONTRAST CT CERVICAL SPINE WITHOUT CONTRAST  Technique:  Multidetector CT imaging of the head and cervical spine was performed following the standard protocol without intravenous contrast.  Multiplanar CT image reconstructions of the cervical spine were also generated.  Comparison:  Head CT 04/29/2011.  CT HEAD  Findings: There is some soft tissue swelling in the right occipital scalp.  Moderate cerebral and cerebellar atrophy.  Extensive patchy and confluent areas of decreased attenuation throughout the deep and periventricular white matter of the cerebral hemispheres bilaterally, compatible with  chronic microvascular ischemic disease. No acute displaced skull fractures are identified.  No acute intracranial abnormality.  Specifically, no evidence of acute post-traumatic intracranial hemorrhage, no definite regions of acute/subacute cerebral ischemia, no focal mass, mass effect, hydrocephalus or abnormal intra or extra-axial fluid collections. Visualized paranasal sinuses and mastoids are well pneumatized, with exception of a small low attenuation air fluid level in the right maxillary sinus.  IMPRESSION: 1.  Right occipital scalp hematoma/contusion, without underlying displaced skull fracture or signs of significant acute intracranial trauma. 2.  Moderate cerebral and cerebellar atrophy with extensive chronic microvascular ischemic changes throughout the cerebral white matter.  CT CERVICAL SPINE  Findings: No acute displaced fractures of the cervical spine. Prevertebral soft tissues are normal.   Multilevel degenerative disc disease, most severe at C4-C5, C5-C6 and C6-C7.  Multilevel facet arthropathy is also noted.  There is 4 mm of anterolisthesis of C6 upon C7, favored to be chronic.  Alignment is otherwise anatomic. Visualized portions of the upper thorax are remarkable for bilateral apical pleuroparenchymal thickening.  In addition, there is a 7 mm nodular-appearing pleural-based density in the posterior right upper lobe (image 59 of series 6), as well as a 3 mm right upper lobe nodule (image 60 of series 6).  IMPRESSION: 1.  No evidence of significant acute traumatic injury to the cervical spine. 2.  Multilevel degenerative disc disease and cervical spondylosis, as above. 3.  7 mm pleural based nodular density in the posterior aspect of the right upper lobe.  This is favored to represent part of the pleuroparenchymal scarring which is seen throughout the remaining portions of the apices of the lungs bilaterally, however, this is slightly more nodular in appearance in the other regions, and may warrant attention on follow-up studies if clinically indicated. If the patient is at high risk for bronchogenic carcinoma, follow-up chest CT at 3-6 months is recommended.  If the patient is at low risk for bronchogenic carcinoma, follow-up chest CT at 6-12 months is recommended.  This recommendation follows the consensus statement: Guidelines for Management of Small Pulmonary Nodules Detected on CT Scans: A Statement from the Fleischner Society as published in Radiology 2005; 237:395-400.   Original Report Authenticated By: Trudie Reed, M.D.   Ct Cervical Spine Wo Contrast  10/04/2012   *RADIOLOGY REPORT*  Clinical Data: Recurrent falls, trauma, pain  CT CERVICAL SPINE WITHOUT CONTRAST  Technique:  Multidetector CT imaging of the cervical spine was performed. Multiplanar CT image reconstructions were also generated.  Comparison: 10/02/2012  Findings: Bones are osteopenic.  Diffuse degenerative disc  disease and spondylosis noted at all levels with anterior osteophytes. Marked diffuse facet arthropathy of the cervical spine.  Stable alignment.  Chronic minimal anterior listhesis of C6-C7 suspect related to degenerative change.  No compression fracture, wedge shaped deformity, subluxation or dislocation.  Facets aligned. Normal prevertebral soft tissues.  Intact odontoid.  Carotid calcifications present bilaterally.  Apical scarring present.  No soft tissue asymmetry in the neck.  IMPRESSION: Stable degenerative changes, spondylosis and facet arthropathy throughout the cervical spine.  No displaced fracture or malalignment.   Original Report Authenticated By: Judie Petit. Miles Costain, M.D.   Ct Cervical Spine Wo Contrast  10/02/2012   *RADIOLOGY REPORT*  Clinical Data:  History of fall.  Laceration to the head.  CT HEAD WITHOUT CONTRAST CT CERVICAL SPINE WITHOUT CONTRAST  Technique:  Multidetector CT imaging of the head and cervical spine was performed following the standard protocol without intravenous contrast.  Multiplanar CT image reconstructions of the  cervical spine were also generated.  Comparison:  Head CT 04/29/2011.  CT HEAD  Findings: There is some soft tissue swelling in the right occipital scalp.  Moderate cerebral and cerebellar atrophy.  Extensive patchy and confluent areas of decreased attenuation throughout the deep and periventricular white matter of the cerebral hemispheres bilaterally, compatible with chronic microvascular ischemic disease. No acute displaced skull fractures are identified.  No acute intracranial abnormality.  Specifically, no evidence of acute post-traumatic intracranial hemorrhage, no definite regions of acute/subacute cerebral ischemia, no focal mass, mass effect, hydrocephalus or abnormal intra or extra-axial fluid collections. Visualized paranasal sinuses and mastoids are well pneumatized, with exception of a small low attenuation air fluid level in the right maxillary sinus.   IMPRESSION: 1.  Right occipital scalp hematoma/contusion, without underlying displaced skull fracture or signs of significant acute intracranial trauma. 2.  Moderate cerebral and cerebellar atrophy with extensive chronic microvascular ischemic changes throughout the cerebral white matter.  CT CERVICAL SPINE  Findings: No acute displaced fractures of the cervical spine. Prevertebral soft tissues are normal.  Multilevel degenerative disc disease, most severe at C4-C5, C5-C6 and C6-C7.  Multilevel facet arthropathy is also noted.  There is 4 mm of anterolisthesis of C6 upon C7, favored to be chronic.  Alignment is otherwise anatomic. Visualized portions of the upper thorax are remarkable for bilateral apical pleuroparenchymal thickening.  In addition, there is a 7 mm nodular-appearing pleural-based density in the posterior right upper lobe (image 59 of series 6), as well as a 3 mm right upper lobe nodule (image 60 of series 6).  IMPRESSION: 1.  No evidence of significant acute traumatic injury to the cervical spine. 2.  Multilevel degenerative disc disease and cervical spondylosis, as above. 3.  7 mm pleural based nodular density in the posterior aspect of the right upper lobe.  This is favored to represent part of the pleuroparenchymal scarring which is seen throughout the remaining portions of the apices of the lungs bilaterally, however, this is slightly more nodular in appearance in the other regions, and may warrant attention on follow-up studies if clinically indicated. If the patient is at high risk for bronchogenic carcinoma, follow-up chest CT at 3-6 months is recommended.  If the patient is at low risk for bronchogenic carcinoma, follow-up chest CT at 6-12 months is recommended.  This recommendation follows the consensus statement: Guidelines for Management of Small Pulmonary Nodules Detected on CT Scans: A Statement from the Fleischner Society as published in Radiology 2005; 237:395-400.   Original Report  Authenticated By: Trudie Reed, M.D.   Ct Pelvis Wo Contrast  10/02/2012   *RADIOLOGY REPORT*  Clinical Data: Larey Seat.  Right-sided pelvic pain.  CT PELVIS WITHOUT CONTRAST  Technique:  Multidetector CT imaging of the pelvis was performed following the standard protocol without intravenous contrast.  Comparison: Radiographs 10/02/2012.  Findings: There are right-sided sacral fractures and right-sided superior and inferior pubic rami fractures.  No significant displacement.  Both hips are normally located.  No hip fracture. Moderate hip joint degenerative changes bilaterally and chondrocalcinosis.  Lower lumbar degenerative changes vertically involving the facet joints.  A large calcified uterine fibroid is noted on the right side.  No significant intrapelvic abnormalities.  Advanced atherosclerotic calcifications involving the aorta and iliac arteries.  IMPRESSION:  1.  Right-sided sacral fractures. 2.  Right-sided superior and inferior pubic rami fractures.   Original Report Authenticated By: Rudie Meyer, M.D.   Dg Chest Port 1 View  10/05/2012   *RADIOLOGY REPORT*  Clinical  Data: Difficulty breathing  PORTABLE CHEST - 1 VIEW  Comparison: 10/04/2012  Findings: The cardiac shadow is stable.  The lungs are again well- aerated without acute infiltrate or sizable effusion.  No gross bony abnormality is noted.  IMPRESSION: No change from previous exam.   Original Report Authenticated By: Alcide Clever, M.D.   Dg Knee Complete 4 Views Right  10/04/2012   *RADIOLOGY REPORT*  Clinical Data: Fall with right knee pain.  RIGHT KNEE - COMPLETE 4+ VIEW  Comparison: None  Findings: There is no evidence of acute fracture, subluxation or dislocation. Mild tricompartmental joint space narrowing is present. There is no evidence of joint effusion. Chondrocalcinosis is identified. Remote injury along the medial femoral condyle noted. No focal lesions are identified.  IMPRESSION: No evidence of acute abnormality.  Mild  tricompartmental degenerative changes and chondrocalcinosis/CPPD.   Original Report Authenticated By: Harmon Pier, M.D.    Microbiology: No results found for this or any previous visit (from the past 240 hour(s)).   Labs: Basic Metabolic Panel:  Recent Labs Lab 10/02/12 1145 10/03/12 0415 10/04/12 0825 10/05/12 0435  NA 134* 136 135 138  K 3.3* 3.6 3.6 3.2*  CL 99 103 103 109  CO2 24 27 23 22   GLUCOSE 112* 133* 127* 95  BUN 34* 33* 38* 38*  CREATININE 0.93 0.91 0.85 0.85  CALCIUM 9.2 9.1 8.8 8.0*   Liver Function Tests: No results found for this basename: AST, ALT, ALKPHOS, BILITOT, PROT, ALBUMIN,  in the last 168 hours No results found for this basename: LIPASE, AMYLASE,  in the last 168 hours No results found for this basename: AMMONIA,  in the last 168 hours CBC:  Recent Labs Lab 10/02/12 1145 10/03/12 0415 10/04/12 0825 10/05/12 0435  WBC 15.5* 10.2 13.5* 9.2  NEUTROABS 13.0*  --   --   --   HGB 9.8* 9.1* 9.2* 7.5*  HCT 29.0* 26.5* 26.9* 22.4*  MCV 90.3 89.5 89.1 91.1  PLT 172 142* 139* PLATELET CLUMPS NOTED ON SMEAR, COUNT APPEARS ADEQUATE   Cardiac Enzymes: No results found for this basename: CKTOTAL, CKMB, CKMBINDEX, TROPONINI,  in the last 168 hours BNP: BNP (last 3 results) No results found for this basename: PROBNP,  in the last 8760 hours CBG: No results found for this basename: GLUCAP,  in the last 168 hours     Signed:  Chaya Jan  [facility] Pager: [phone] 10/05/2012, 11:31 AM

## 2012-10-05 NOTE — Care Management Note (Signed)
    Page 1 of 1   10/05/2012     1:13:56 PM   CARE MANAGEMENT NOTE 10/05/2012  Patient:  Ashlee [name], Ashlee [name]   Account Number:  [id number]  Date Initiated:  10/05/2012  Documentation initiated by:  Lanier Clam  Subjective/Objective Assessment:   79 Y/O F ADMITTED W/FALL.     Action/Plan:   FROM SNF.   Anticipated DC Date:  10/05/2012   Anticipated DC Plan:  SKILLED NURSING FACILITY      DC Planning Services  CM consult      Choice offered to / List presented to:             Status of service:  Completed, signed off Medicare Important Message given?   (If response is "NO", the following Medicare IM given date fields will be blank) Date Medicare IM given:   Date Additional Medicare IM given:    Discharge Disposition:  SKILLED NURSING FACILITY  Per UR Regulation:  Reviewed for med. necessity/level of care/duration of stay  If discussed at Long Length of Stay Meetings, dates discussed:    Comments:  10/05/12 Johnathon Olden RN,BSN NCM WEEKEND [phone] D/C SNF.

## 2012-10-09 ENCOUNTER — Non-Acute Institutional Stay (SKILLED_NURSING_FACILITY): Payer: Medicare Other | Admitting: Internal Medicine

## 2012-10-09 DIAGNOSIS — IMO0002 Reserved for concepts with insufficient information to code with codable children: Secondary | ICD-10-CM

## 2012-10-09 DIAGNOSIS — E785 Hyperlipidemia, unspecified: Secondary | ICD-10-CM

## 2012-10-09 DIAGNOSIS — S3210XS Unspecified fracture of sacrum, sequela: Secondary | ICD-10-CM

## 2012-10-09 DIAGNOSIS — M810 Age-related osteoporosis without current pathological fracture: Secondary | ICD-10-CM

## 2012-10-09 DIAGNOSIS — I10 Essential (primary) hypertension: Secondary | ICD-10-CM

## 2012-10-16 ENCOUNTER — Encounter: Payer: Self-pay | Admitting: Adult Health

## 2012-10-16 ENCOUNTER — Non-Acute Institutional Stay (SKILLED_NURSING_FACILITY): Payer: Medicare Other | Admitting: Adult Health

## 2012-10-16 DIAGNOSIS — D509 Iron deficiency anemia, unspecified: Secondary | ICD-10-CM

## 2012-10-16 NOTE — Progress Notes (Signed)
Patient ID: Ashlee [name], female   DOB: September 12, 1914, 79 y.o.   MRN: [id number]       PROGRESS NOTE  DATE: 10/16/2012  FACILITY:  [facility]  LEVEL OF CARE: SNF (31)  Acute Visit  CHIEF COMPLAINT:  Manage anemia  HISTORY OF PRESENT ILLNESS: This is a 79 year old female who was noted to have hemoglobin of 8.2 . No complaints of shortness of breath nor chest pain.  Previous, 10/03/12, hgb 9.1 . She is currently having a short-term rehabilitation due to right sacral fractures, right superior superior and inferior pubic rami fracture and head trauma sustained from a fall at home.   PAST MEDICAL HISTORY : Reviewed.  No changes.  CURRENT MEDICATIONS: Reviewed per [facility]  REVIEW OF SYSTEMS:  GENERAL: no change in appetite, no fatigue, no weight changes, no fever, chills or weakness RESPIRATORY: no cough, SOB, DOE,, wheezing, hemoptysis CARDIAC: no chest pain, edema or palpitations GI: no abdominal pain, diarrhea, constipation, heart burn, nausea or vomiting  PHYSICAL EXAMINATION  VS:  T 98.1       P81       RR20       BP115/62      POX 96%       WT114.8 (Lb)  GENERAL: no acute distress, normal body habitus EYES: conjunctivae normal, sclerae normal, normal eye lids NECK: supple, trachea midline, no neck masses, no thyroid tenderness, no thyromegaly LYMPHATICS: no LAN in the neck, no supraclavicular LAN RESPIRATORY: breathing is even & unlabored, BS CTAB CARDIAC: RRR, no murmur,no extra heart sounds, no edema GI: abdomen soft, normal BS, no masses, no tenderness, no hepatomegaly, no splenomegaly PSYCHIATRIC: the patient is alert & oriented to person, affect & behavior appropriate  LABS/RADIOLOGY: 10/14/12  Serum iron 13  Vitamin B12 726  Folate 1489 10/10/12  Wbc 10.4  hgb 8.2  hct 26.9  NA 142  K 4.9  Glucose 130  BUN 38  Creatinine 0.8  Calcium 9.2   ASSESSMENT/PLAN:  Anemia, iron deficiency -  Start FeSO4 325 mg 1 tab PO BID; CBC in 1 week  CPT CODE:  40981

## 2012-11-05 DIAGNOSIS — S3210XA Unspecified fracture of sacrum, initial encounter for closed fracture: Secondary | ICD-10-CM | POA: Insufficient documentation

## 2012-11-05 NOTE — Progress Notes (Signed)
Patient ID: Ashlee [name], female   DOB: [birth year], 79 y.o.   MRN: [id number]        HISTORY & PHYSICAL  DATE: 10/09/2012   FACILITY: [facility]  LEVEL OF CARE: SNF (31)  ALLERGIES:  Allergies  Allergen Reactions  . Penicillins Other (See Comments)    Reaction Unknown    CHIEF COMPLAINT:  Manage hypertension, hyperlipidemia, and osteoporosis.    HISTORY OF PRESENT ILLNESS:  The patient is a 79 year-old, Caucasian female who was hospitalized after a fall and then admitted to this facility for short-term rehabilitation.  She has the following problems:    HTN: Pt 's HTN remains stable.  Denies CP, sob, DOE, pedal edema, headaches, dizziness or visual disturbances.  No complications from the medications currently being used.  Last BP :  108/57.     HYPERLIPIDEMIA: No complications from the medications presently being used. Last fasting lipid panel showed :  Not available.    OSTEOPOROSIS: The patient's osteoporosis remains stable. The patient denies recent worsening of pain and the range of motion remains stable. There has not been any evidence of fractures. No complications noted from the medications presently being used.  The patient suffered sacral fractures after a fall.     PAST MEDICAL HISTORY :  Past Medical History  Diagnosis Date  . Hypertension   . High cholesterol   . Spinal stenosis   . Osteoporosis   . Neuropathy   . Squamous cell skin cancer   . Overactive bladder     PAST SURGICAL HISTORY: Past Surgical History  Procedure Laterality Date  . Tonsillectomy      20's    SOCIAL HISTORY:  reports that she has never smoked. She does not have any smokeless tobacco history on file. She reports that  drinks alcohol. She reports that she does not use illicit drugs.  FAMILY HISTORY:  Family History  Problem Relation Age of Onset  . Heart failure Mother   . Diabetes Brother     CURRENT MEDICATIONS: Reviewed per [facility]  REVIEW OF SYSTEMS:   See HPI otherwise 14 point ROS is negative.  PHYSICAL EXAMINATION  VS:  T 97.6       P 81      RR 20      BP 108/57      POX 95% room air       WT (Lb) 127.6    GENERAL: no acute distress, normal body habitus EYES: conjunctivae normal, sclerae normal, normal eye lids MOUTH/THROAT: lips without lesions,no lesions in the mouth,tongue is without lesions,uvula elevates in midline NECK: supple, trachea midline, no neck masses, no thyroid tenderness, no thyromegaly LYMPHATICS: no LAN in the neck, no supraclavicular LAN RESPIRATORY: breathing is even & unlabored, BS CTAB CARDIAC: RRR, no murmur,no extra heart sounds, no edema GI:  ABDOMEN: abdomen soft, normal BS, no masses, no tenderness  LIVER/SPLEEN: no hepatomegaly, no splenomegaly MUSCULOSKELETAL: HEAD: normal to inspection & palpation BACK: no kyphosis, scoliosis or spinal processes tenderness EXTREMITIES: LEFT UPPER EXTREMITY: full range of motion, normal strength & tone RIGHT UPPER EXTREMITY: range of motion unable to assess   LEFT LOWER EXTREMITY: strength and range of motion unable to assess     RIGHT LOWER EXTREMITY: strength and range of motion unable to assess    PSYCHIATRIC: the patient is alert & oriented to person, affect & behavior appropriate  LABS/RADIOLOGY: Chest x-ray:  No acute disease.    Pelvic x-ray:  No acute  findings.    Sacral/coccyx x-ray:  Did not show acute fracture.    Right hip x-ray:  No acute bony abnormalities.    CT of the head:  No acute findings.    CT of the cervical spine:  Did not show any acute findings.    CT of the pelvis:  Showed right-sided sacral fracture and right-sided superior and inferior pubic rami fractures.    Right knee x-ray:  Did not show any acute abnormalities.     Potassium 3.2, BUN 38, otherwise BMP normal.    Hemoglobin 7.5, MCV 91.1, platelets 139, WBC 9.2.    ASSESSMENT/PLAN:  Hypertension.  Stable.    Hyperlipidemia.  Continue statin.    Osteoporosis.   Continue current medications.    Right-sided sacral fracture.  Continue pain management.    Right-sided superior and inferior pubic rami fractures.  Continue pain management.    Hypokalemia.  We will reassess.    Anemia of chronic disease.  Reassess.    Check CBC and BMP.    I have reviewed patient's medical records received at admission/from hospitalization.  CPT CODE: 16109

## 2012-11-07 ENCOUNTER — Non-Acute Institutional Stay (SKILLED_NURSING_FACILITY): Payer: Medicare Other | Admitting: Adult Health

## 2012-11-07 DIAGNOSIS — E785 Hyperlipidemia, unspecified: Secondary | ICD-10-CM

## 2012-11-07 DIAGNOSIS — D509 Iron deficiency anemia, unspecified: Secondary | ICD-10-CM

## 2012-11-07 DIAGNOSIS — M810 Age-related osteoporosis without current pathological fracture: Secondary | ICD-10-CM

## 2012-11-07 DIAGNOSIS — R630 Anorexia: Secondary | ICD-10-CM

## 2012-11-07 DIAGNOSIS — I10 Essential (primary) hypertension: Secondary | ICD-10-CM

## 2012-11-07 DIAGNOSIS — S3210XD Unspecified fracture of sacrum, subsequent encounter for fracture with routine healing: Secondary | ICD-10-CM

## 2012-11-07 DIAGNOSIS — IMO0001 Reserved for inherently not codable concepts without codable children: Secondary | ICD-10-CM

## 2012-11-27 ENCOUNTER — Non-Acute Institutional Stay (SKILLED_NURSING_FACILITY): Payer: Medicare Other | Admitting: Adult Health

## 2012-11-27 DIAGNOSIS — S3210XD Unspecified fracture of sacrum, subsequent encounter for fracture with routine healing: Secondary | ICD-10-CM

## 2012-11-27 DIAGNOSIS — E785 Hyperlipidemia, unspecified: Secondary | ICD-10-CM

## 2012-11-27 DIAGNOSIS — D509 Iron deficiency anemia, unspecified: Secondary | ICD-10-CM

## 2012-11-27 DIAGNOSIS — R630 Anorexia: Secondary | ICD-10-CM

## 2012-11-27 DIAGNOSIS — M810 Age-related osteoporosis without current pathological fracture: Secondary | ICD-10-CM

## 2012-11-27 DIAGNOSIS — I10 Essential (primary) hypertension: Secondary | ICD-10-CM

## 2012-11-27 DIAGNOSIS — IMO0001 Reserved for inherently not codable concepts without codable children: Secondary | ICD-10-CM

## 2012-11-28 DIAGNOSIS — R630 Anorexia: Secondary | ICD-10-CM | POA: Insufficient documentation

## 2012-11-28 NOTE — Progress Notes (Signed)
Patient ID: Ashlee [name], female   DOB: Jun 22, 1914, 79 y.o.   MRN: [id number]       PROGRESS NOTE  DATE: 11/27/2012   FACILITY: [facility]  LEVEL OF CARE: SNF (31)  Acute Visit  CHIEF COMPLAINT:  Discharge Notes  HISTORY OF PRESENT ILLNESS:This is a 79 year old female who is for discharge to an Assisted Living Facility with Home health PT for endurance, OT for ADLs and Nursing for medication management. DME: wheelchair with cushion to assist with ADLs and for safety. She has been admitted to [facility] on 10/03/12 from [facility]. She had a fall at home and sustained right sacral fracture, right superior and inferior pubic rami. Patient was admitted to this facility for short-term rehabilitation after the patient's recent hospitalization.  Patient has completed SNF rehabilitation and therapy has cleared the patient for discharge.  Reassessment of ongoing problem(s):  HTN: Pt 's HTN remains stable.  Denies CP, sob, DOE, pedal edema, headaches, dizziness or visual disturbances.  No complications from the medications currently being used.  Last BP : 130/65  HYPERLIPIDEMIA: No complications from the medications presently being used.   OSTEOPOROSIS: The patient's osteoporosis remains stable. The patient denies recent worsening of pain and the range of motion remains stable. There has not been any evidence of fractures. No complications noted from the medications presently being used. PAST MEDICAL HISTORY : Reviewed.  No changes.  CURRENT MEDICATIONS: Reviewed per [facility]  REVIEW OF SYSTEMS:  GENERAL: no change in appetite, no fatigue, no weight changes, no fever, chills or weakness RESPIRATORY: no cough, SOB, DOE, wheezing, hemoptysis CARDIAC: no chest pain, edema or palpitations GI: no abdominal pain, diarrhea, constipation, heart burn, nausea or vomiting  PHYSICAL EXAMINATION  VS:  T98.1       P87       RR20      BP130/65            WT115.2 (Lb)  GENERAL:  no acute distress, normal body habitus EYES: conjunctivae normal, sclerae normal, normal eye lids NECK: supple, trachea midline, no neck masses, no thyroid tenderness, no thyromegaly RESPIRATORY: breathing is even & unlabored, BS CTAB CARDIAC: RRR, no murmur,no extra heart sounds, no edema GI: abdomen soft, normal BS, no masses, no tenderness, no hepatomegaly, no splenomegaly PSYCHIATRIC: the patient is alert & oriented to person, affect & behavior appropriate  LABS/RADIOLOGY: 11/06/12 WBC 10.3 hematocrit 24.5 hemoglobin 7.5 10/31/12 WBC 9.5 hemoglobin 8.5 hematocrit 28.0 10/23/12 WBC 8.8 hemoglobin 8.1 hematocrit 26.5 10/14/12  Serum iron 13  Vitamin B12 726  Folate 1489 10/10/12  Wbc 10.4  hgb 8.2  hct 26.9  NA 142  K 4.9  Glucose 130  BUN 38  Creatinine 0.8  Calcium 9.2   ASSESSMENT/PLAN:  Hypertension - well controlled  Osteoporosis - continue Fosamax  Iron deficiency anemia - stable; continue Iron (FeSO4)  Hyperlipidemia  - continue Zocor  Right sacral fracture - for Home health, PT, OT and Nursing  Poor appetite - continue Eldertonic    I have filled out patient's discharge paperwork and written prescriptions.  Patient will receive home health PT, OT and Nursing. DME provided: wheelchair with cushion   Total discharge time: Greater than 30 minutes Discharge time involved coordination of the discharge process with social worker, nursing staff and therapy department. Medical justification for home health services/DME verified.    CPT CODE: 13086

## 2012-11-28 NOTE — Progress Notes (Signed)
Patient ID: Ashlee [name], female   DOB: 1915/01/12, 79 y.o.   MRN: [id number]       PROGRESS NOTE  DATE: 11/07/2012  FACILITY: Nursing Home Location: [facility]  LEVEL OF CARE: SNF (31)  Routine Visit  CHIEF COMPLAINT:  Manage hypertension, osteoporosis and anemia  HISTORY OF PRESENT ILLNESS:  REASSESSMENT OF ONGOING PROBLEM(S):  HTN: Pt 's HTN remains stable.  Denies CP, sob, DOE, pedal edema, headaches, dizziness or visual disturbances.  No complications from the medications currently being used.  Last BP : 127/78  ANEMIA: The anemia has been stable. The patient denies fatigue, melena or hematochezia. No complications from the medications currently being used. 10/14  hgb 7.5  OSTEOPOROSIS: The patient's osteoporosis remains stable. The patient denies recent worsening of pain and the range of motion remains stable. There has not been any evidence of fractures. No complications noted from the medications presently being used.   PAST MEDICAL HISTORY : Reviewed.  No changes.  CURRENT MEDICATIONS: Reviewed per [facility]  REVIEW OF SYSTEMS:  GENERAL: poor appetite, no fatigue, no fever, chills or weakness, + weight loss RESPIRATORY: no cough, SOB, DOE, wheezing, hemoptysis CARDIAC: no chest pain, edema or palpitations GI: no abdominal pain, diarrhea, constipation, heart burn, nausea or vomiting  PHYSICAL EXAMINATION  VS:  T98.1       P90      RR20      BP127/78     POX96 %     WT111.6 (Lb)    Has 12.54% weight loss in 34 days with PO intake of 50 - 100 % GENERAL: no acute distress, normal body habitus NECK: supple, trachea midline, no neck masses, no thyroid tenderness, no thyromegaly LYMPHATICS: no LAN in the neck, no supraclavicular LAN RESPIRATORY: breathing is even & unlabored, BS CTAB CARDIAC: RRR, no murmur,no extra heart sounds, no edema GI: abdomen soft, normal BS, no masses, no tenderness, no hepatomegaly, no splenomegaly PSYCHIATRIC: the patient is  alert & oriented to person, affect & behavior appropriate  LABS/RADIOLOGY: 11/06/12 WBC 10.3 hematocrit 24.5 hemoglobin 7.5 10/31/12 WBC 9.5 hemoglobin 8.5 hematocrit 28.0 10/23/12 WBC 8.8 hemoglobin 8.1 hematocrit 26.5 10/14/12  Serum iron 13  Vitamin B12 726  Folate 1489 10/10/12  Wbc 10.4  hgb 8.2  hct 26.9  NA 142  K 4.9  Glucose 130  BUN 38  Creatinine 0.8  Calcium 9.2   ASSESSMENT/PLAN:  Hypertension - will controlled  Osteoporosis - continue Fosamax  Iron deficiency anemia - stable; still local blood times 3; CBC in one week; hold aspirin x1 week  Hyperlipidemia  - continue Zocor  Right sacral fracture - continue rehabilitation  Poor appetite - start Eldertonic 15 ml PO BID   CPT CODE: 65784

## 2012-12-19 ENCOUNTER — Ambulatory Visit (INDEPENDENT_AMBULATORY_CARE_PROVIDER_SITE_OTHER): Payer: Medicare Other

## 2012-12-19 VITALS — BP 132/67 | HR 82 | Resp 20 | Ht <= 58 in | Wt 114.2 lb

## 2012-12-19 DIAGNOSIS — G629 Polyneuropathy, unspecified: Secondary | ICD-10-CM

## 2012-12-19 DIAGNOSIS — M1990 Unspecified osteoarthritis, unspecified site: Secondary | ICD-10-CM

## 2012-12-19 DIAGNOSIS — M2031 Hallux varus (acquired), right foot: Secondary | ICD-10-CM

## 2012-12-19 DIAGNOSIS — M2030 Hallux varus (acquired), unspecified foot: Secondary | ICD-10-CM

## 2012-12-19 DIAGNOSIS — L97509 Non-pressure chronic ulcer of other part of unspecified foot with unspecified severity: Secondary | ICD-10-CM

## 2012-12-19 DIAGNOSIS — G589 Mononeuropathy, unspecified: Secondary | ICD-10-CM

## 2012-12-19 MED ORDER — SILVER SULFADIAZINE 1 % EX CREA: 1.0000 "application " | TOPICAL_CREAM | Freq: Every day | CUTANEOUS | Status: AC

## 2012-12-19 NOTE — Progress Notes (Signed)
  Subjective:    Patient ID: Ashlee [name], female    DOB: [birth year], 79 y.o.   MRN: [id number] "She has an ulcer on her right big toe," stated patient's sister-in-law.  HPI Comments: N  Red, has a sore L  Ulcer Hallux Rt. D  2-3 months O  Off and on C  About the same A  none T  Ointment       Review of Systems  Constitutional: Negative.   HENT: Positive for hearing loss.   Eyes: Negative.   Respiratory: Negative.   Cardiovascular: Negative.   Endocrine: Negative.   Genitourinary: Positive for frequency.  Musculoskeletal: Negative.   Skin: Positive for wound.  Allergic/Immunologic: Negative.   Neurological: Negative.   Hematological: Negative.   Psychiatric/Behavioral: Negative.        Objective:   Physical Exam Neurovascular status as follows pedal pulses DP thready pulse one over 4 PT nonpalpable bilateral temperature is warm to cool turgor diminished mild varicosities noted. Patient is ambulatory however minimally has severe rigid contractures 2 through 5 bilateral as well as a hallux malleus is wearing a pair of easy spirits type shoes which may be somewhat too tight and narrow and shallow in the toe box. Patient does have about 2-3 mm ulceration over the dorsal hallux IP joint right goes down to dermal subdermal junction. Mild serous drainage is noted no purulent discharge or drainage no ascending cellulitis or lymphangitis noted. No other orthopedic biomechanical problems noted no secondary infection is noted. Should note there is absent hair growth her skin is relatively fragile and frail nail somewhat criptotic incurvated patient does have decreased absent epicritic sensations noted on [facility] testing. 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 As well as digital deformity/malleus of the hallux and in proper fitting shoes. Recommended a soft  upper shoe at this time the ulcer is cleansed with all cleansed Silvadene and 8 and tube foam padding applied a pocket or holes cut into her shoe to offload pressure on the hallux however did recommend a soft or extra-depth type shoe which can be obtained at the shoe market or order here through our office. Suggest a one-month followup. Prescription for Silvadene is 40 to pharmacy at this time. Do daily cleansing with soap and water and Silvadene and gauze dressing change daily.  Alvan Dame DP

## 2012-12-19 NOTE — Patient Instructions (Signed)
Instructions for Wound Care  The most important step to healing a foot wound is to reduce the pressure on your foot - it is extremely important to stay off your foot as much as possible and wear the shoe/boot as instructed.  Cleanse your foot with saline wash or warm soapy water (dial antibacterial soap or similar).  Blot dry.  Apply prescribed medication to your wound and cover with gauze and a bandage.  May hold bandage in place with Coban (self sticky wrap), Ace bandage or tape.  You may find dressing supplies at your local [employer], [employer], drug store or medical supply store.  Your prescribed topical medication is :  Silvadene Cream (twice daily) apply Silvadene cream and a Band-Aid or gauze dressing once daily after cleansing with soap and water  [employer] is a mail order medical supply company that we use to provide some of our would care products.  If we use their service of you, you will receive the product by mail.  If you have not received the medication in 3 business days, please call our office.  If you notice any foul odor, increase in pain, pus, increased swelling, red streaks or generalized redness occurring in your foot or leg-Call our office immediately to be seen.  This may be a sign of a limb or life threatening infection that will need prompt attention.  Alvan Dame, [facility]  [facility]  [phone] [place]  [phone] [facility]

## 2013-01-16 ENCOUNTER — Ambulatory Visit: Payer: Medicare Other

## 2013-01-16 ENCOUNTER — Ambulatory Visit (INDEPENDENT_AMBULATORY_CARE_PROVIDER_SITE_OTHER): Payer: Medicare Other

## 2013-01-16 VITALS — BP 142/76 | HR 82 | Resp 12

## 2013-01-16 DIAGNOSIS — L97509 Non-pressure chronic ulcer of other part of unspecified foot with unspecified severity: Secondary | ICD-10-CM

## 2013-01-16 DIAGNOSIS — M2030 Hallux varus (acquired), unspecified foot: Secondary | ICD-10-CM

## 2013-01-16 DIAGNOSIS — M1990 Unspecified osteoarthritis, unspecified site: Secondary | ICD-10-CM

## 2013-01-16 DIAGNOSIS — M2031 Hallux varus (acquired), right foot: Secondary | ICD-10-CM

## 2013-01-16 NOTE — Patient Instructions (Signed)

## 2013-01-16 NOTE — Progress Notes (Signed)
   Subjective:    Patient ID: Ashlee [name], female    DOB: 09/24/1914, 79 y.o.   MRN: [id number]  HPI Comments: ''RT FOOT GREAT TOENAIL IS SORE''     Review of Systems no changes to     Objective:   Physical Exam Neurovascular status is intact although diminished bilateral thready pulses noted absent hair growth diminished skin texture turgor nails somewhat criptotic. The ulcer the hallux is resolved there still hallux malleus deformity noted right great toe. There is a slight area of eschar and a slight laceration 2 or 3 mm posterior scratch from an adjacent foot or toe there is no active bleeding no discharge or drainage the ulcer is closed and resolved this time. Suggested keeping solution the feet Silvadene and tube foam pad or applied at this time maintain some cushioning or tube foam padding and accommodative shoes like the SAS shoes she is wearing today       Assessment & Plan:  Resolved ulceration secondary to arthrosis of the hallux malleus deformity right great toe. Maintain accommodative shoes followup in the future as needed may resume all normal bathing and hygiene me followup with pedicures if she needs.  Alvan Dame DPM

## 2013-01-31 ENCOUNTER — Emergency Department (HOSPITAL_COMMUNITY): Payer: Medicare Other

## 2013-01-31 ENCOUNTER — Encounter (HOSPITAL_COMMUNITY)
Admission: EM | Disposition: A | Discharge status: Hospice | Payer: Self-pay | Source: Home / Self Care | Attending: Internal Medicine

## 2013-01-31 ENCOUNTER — Other Ambulatory Visit: Payer: Self-pay | Admitting: Gastroenterology

## 2013-01-31 ENCOUNTER — Encounter (HOSPITAL_COMMUNITY): Payer: Self-pay | Admitting: Emergency Medicine

## 2013-01-31 ENCOUNTER — Inpatient Hospital Stay (HOSPITAL_COMMUNITY)
Admission: EM | Admit: 2013-01-31 | Discharge: 2013-02-08 | DRG: 392 | Disposition: A | Discharge status: Hospice | Payer: Medicare Other | Attending: Internal Medicine | Admitting: Internal Medicine

## 2013-01-31 DIAGNOSIS — E7800 Pure hypercholesterolemia, unspecified: Secondary | ICD-10-CM | POA: Diagnosis present

## 2013-01-31 DIAGNOSIS — I4891 Unspecified atrial fibrillation: Secondary | ICD-10-CM

## 2013-01-31 DIAGNOSIS — T18128A Food in esophagus causing other injury, initial encounter: Secondary | ICD-10-CM

## 2013-01-31 DIAGNOSIS — Z515 Encounter for palliative care: Secondary | ICD-10-CM

## 2013-01-31 DIAGNOSIS — R627 Adult failure to thrive: Secondary | ICD-10-CM | POA: Diagnosis present

## 2013-01-31 DIAGNOSIS — E876 Hypokalemia: Secondary | ICD-10-CM

## 2013-01-31 DIAGNOSIS — M810 Age-related osteoporosis without current pathological fracture: Secondary | ICD-10-CM | POA: Diagnosis present

## 2013-01-31 DIAGNOSIS — K209 Esophagitis, unspecified without bleeding: Secondary | ICD-10-CM

## 2013-01-31 DIAGNOSIS — N318 Other neuromuscular dysfunction of bladder: Secondary | ICD-10-CM | POA: Diagnosis present

## 2013-01-31 DIAGNOSIS — W44F3XA Food entering into or through a natural orifice, initial encounter: Secondary | ICD-10-CM

## 2013-01-31 DIAGNOSIS — Z7982 Long term (current) use of aspirin: Secondary | ICD-10-CM

## 2013-01-31 DIAGNOSIS — K222 Esophageal obstruction: Secondary | ICD-10-CM | POA: Diagnosis present

## 2013-01-31 DIAGNOSIS — IMO0002 Reserved for concepts with insufficient information to code with codable children: Secondary | ICD-10-CM | POA: Diagnosis present

## 2013-01-31 DIAGNOSIS — Z79899 Other long term (current) drug therapy: Secondary | ICD-10-CM

## 2013-01-31 DIAGNOSIS — T18108A Unspecified foreign body in esophagus causing other injury, initial encounter: Secondary | ICD-10-CM

## 2013-01-31 DIAGNOSIS — Z85828 Personal history of other malignant neoplasm of skin: Secondary | ICD-10-CM

## 2013-01-31 DIAGNOSIS — R410 Disorientation, unspecified: Secondary | ICD-10-CM | POA: Diagnosis not present

## 2013-01-31 DIAGNOSIS — K2960 Other gastritis without bleeding: Secondary | ICD-10-CM | POA: Diagnosis present

## 2013-01-31 DIAGNOSIS — D509 Iron deficiency anemia, unspecified: Secondary | ICD-10-CM

## 2013-01-31 DIAGNOSIS — E785 Hyperlipidemia, unspecified: Secondary | ICD-10-CM

## 2013-01-31 DIAGNOSIS — Z87891 Personal history of nicotine dependence: Secondary | ICD-10-CM

## 2013-01-31 DIAGNOSIS — J029 Acute pharyngitis, unspecified: Secondary | ICD-10-CM | POA: Diagnosis present

## 2013-01-31 DIAGNOSIS — D638 Anemia in other chronic diseases classified elsewhere: Secondary | ICD-10-CM | POA: Diagnosis present

## 2013-01-31 DIAGNOSIS — K449 Diaphragmatic hernia without obstruction or gangrene: Secondary | ICD-10-CM

## 2013-01-31 DIAGNOSIS — W44F3XS Food entering into or through a natural orifice, sequela: Secondary | ICD-10-CM

## 2013-01-31 DIAGNOSIS — I10 Essential (primary) hypertension: Secondary | ICD-10-CM

## 2013-01-31 DIAGNOSIS — M47812 Spondylosis without myelopathy or radiculopathy, cervical region: Secondary | ICD-10-CM | POA: Diagnosis present

## 2013-01-31 DIAGNOSIS — R1310 Dysphagia, unspecified: Principal | ICD-10-CM

## 2013-01-31 DIAGNOSIS — Z66 Do not resuscitate: Secondary | ICD-10-CM | POA: Diagnosis present

## 2013-01-31 DIAGNOSIS — T18128S Food in esophagus causing other injury, sequela: Secondary | ICD-10-CM

## 2013-01-31 DIAGNOSIS — Z880 Allergy status to penicillin: Secondary | ICD-10-CM

## 2013-01-31 DIAGNOSIS — K269 Duodenal ulcer, unspecified as acute or chronic, without hemorrhage or perforation: Secondary | ICD-10-CM

## 2013-01-31 DIAGNOSIS — K208 Other esophagitis without bleeding: Secondary | ICD-10-CM | POA: Diagnosis present

## 2013-01-31 HISTORY — DX: Unspecified osteoarthritis, unspecified site: M19.90

## 2013-01-31 HISTORY — PX: ESOPHAGOGASTRODUODENOSCOPY: SHX5428

## 2013-01-31 LAB — CBC
Platelets: 241 10*3/uL (ref 150–400)
RBC: 4.32 MIL/uL (ref 3.87–5.11)
RDW: 18 % — ABNORMAL HIGH (ref 11.5–15.5)
WBC: 10.1 10*3/uL (ref 4.0–10.5)

## 2013-01-31 LAB — BASIC METABOLIC PANEL
BUN: 19 mg/dL (ref 6–23)
Calcium: 9.2 mg/dL (ref 8.4–10.5)
Creatinine, Ser: 0.63 mg/dL (ref 0.50–1.10)
GFR calc Af Amer: 85 mL/min — ABNORMAL LOW (ref >90)
GFR calc non Af Amer: 73 mL/min — ABNORMAL LOW (ref >90)
Sodium: 135 mEq/L (ref 135–145)

## 2013-01-31 LAB — RAPID STREP SCREEN (MED CTR MEBANE ONLY): Streptococcus, Group A Screen (Direct): NEGATIVE

## 2013-01-31 SURGERY — EGD (ESOPHAGOGASTRODUODENOSCOPY)
Anesthesia: Moderate Sedation

## 2013-01-31 MED ORDER — METRONIDAZOLE IN NACL 5-0.79 MG/ML-% IV SOLN
500.0000 mg | Freq: Three times a day (TID) | INTRAVENOUS | Status: DC
  Administered 2013-01-31 – 2013-02-02 (×5): 500 mg via INTRAVENOUS
  Filled 2013-01-31 (×7): qty 100

## 2013-01-31 MED ORDER — SODIUM CHLORIDE 0.9 % IV SOLN
1000.0000 mL | INTRAVENOUS | Status: DC
  Administered 2013-01-31: 1000 mL via INTRAVENOUS

## 2013-01-31 MED ORDER — ESOMEPRAZOLE MAGNESIUM 40 MG PO CPDR: 40.0000 mg | DELAYED_RELEASE_CAPSULE | Freq: Every day | ORAL | Status: AC

## 2013-01-31 MED ORDER — ACETAMINOPHEN 650 MG RE SUPP: 650.0000 mg | Freq: Four times a day (QID) | RECTAL | Status: DC | PRN

## 2013-01-31 MED ORDER — HYDRALAZINE HCL 20 MG/ML IJ SOLN
10.0000 mg | INTRAMUSCULAR | Status: DC | PRN
  Administered 2013-02-01 (×2): 10 mg via INTRAVENOUS
  Filled 2013-01-31 (×2): qty 1

## 2013-01-31 MED ORDER — CHLORHEXIDINE GLUCONATE 0.12 % MT SOLN
15.0000 mL | Freq: Two times a day (BID) | OROMUCOSAL | Status: DC
  Administered 2013-02-03 – 2013-02-07 (×6): 15 mL via OROMUCOSAL
  Filled 2013-01-31 (×15): qty 15

## 2013-01-31 MED ORDER — PANTOPRAZOLE SODIUM 40 MG IV SOLR
40.0000 mg | INTRAVENOUS | Status: DC
  Administered 2013-01-31 – 2013-02-06 (×7): 40 mg via INTRAVENOUS
  Filled 2013-01-31 (×9): qty 40

## 2013-01-31 MED ORDER — KCL IN DEXTROSE-NACL 10-5-0.45 MEQ/L-%-% IV SOLN
INTRAVENOUS | Status: AC
  Administered 2013-01-31: 22:00:00 via INTRAVENOUS
  Filled 2013-01-31 (×2): qty 1000

## 2013-01-31 MED ORDER — MIDAZOLAM HCL 10 MG/2ML IJ SOLN
INTRAMUSCULAR | Status: AC
  Filled 2013-01-31: qty 2

## 2013-01-31 MED ORDER — SODIUM CHLORIDE 0.9 % IV SOLN
1000.0000 mL | Freq: Once | INTRAVENOUS | Status: AC
  Administered 2013-01-31: 1000 mL via INTRAVENOUS

## 2013-01-31 MED ORDER — FENTANYL CITRATE 0.05 MG/ML IJ SOLN
50.0000 ug | Freq: Once | INTRAMUSCULAR | Status: AC
  Administered 2013-01-31: 50 ug via INTRAVENOUS
  Filled 2013-01-31: qty 2

## 2013-01-31 MED ORDER — BIOTENE DRY MOUTH MT LIQD
15.0000 mL | Freq: Two times a day (BID) | OROMUCOSAL | Status: DC
  Administered 2013-02-03: 15 mL via OROMUCOSAL

## 2013-01-31 MED ORDER — FENTANYL CITRATE 0.05 MG/ML IJ SOLN
INTRAMUSCULAR | Status: AC
  Filled 2013-01-31: qty 2

## 2013-01-31 MED ORDER — SODIUM CHLORIDE 0.9 % IV SOLN: INTRAVENOUS | Status: DC

## 2013-01-31 MED ORDER — ENOXAPARIN SODIUM 40 MG/0.4ML ~~LOC~~ SOLN
40.0000 mg | SUBCUTANEOUS | Status: DC
  Administered 2013-01-31 – 2013-02-04 (×5): 40 mg via SUBCUTANEOUS
  Filled 2013-01-31 (×7): qty 0.4

## 2013-01-31 MED ORDER — ONDANSETRON HCL 4 MG/2ML IJ SOLN: 4.0000 mg | Freq: Four times a day (QID) | INTRAMUSCULAR | Status: DC | PRN

## 2013-01-31 MED ORDER — ONDANSETRON HCL 4 MG PO TABS: 4.0000 mg | ORAL_TABLET | Freq: Four times a day (QID) | ORAL | Status: DC | PRN

## 2013-01-31 MED ORDER — GLUCAGON HCL (RDNA) 1 MG IJ SOLR
1.0000 mg | Freq: Once | INTRAMUSCULAR | Status: AC
  Administered 2013-01-31: 1 mg via INTRAVENOUS
  Filled 2013-01-31: qty 1

## 2013-01-31 MED ORDER — BUTAMBEN-TETRACAINE-BENZOCAINE 2-2-14 % EX AERO
INHALATION_SPRAY | CUTANEOUS | Status: DC | PRN
  Administered 2013-01-31: 1 via TOPICAL

## 2013-01-31 MED ORDER — MIDAZOLAM HCL 10 MG/2ML IJ SOLN
INTRAMUSCULAR | Status: DC | PRN
  Administered 2013-01-31: 1 mg via INTRAVENOUS
  Administered 2013-01-31: .5 mg via INTRAVENOUS

## 2013-01-31 MED ORDER — ACETAMINOPHEN 325 MG PO TABS
650.0000 mg | ORAL_TABLET | Freq: Four times a day (QID) | ORAL | Status: DC | PRN
Start: 2013-01-31 — End: 2013-02-08

## 2013-01-31 NOTE — ED Notes (Signed)
MD Harrison at bedside. 

## 2013-01-31 NOTE — ED Provider Notes (Signed)
CSN: [id number]     Arrival date & time 01/31/13  0844 History   First MD Initiated Contact with Patient 01/31/13 [phone]     Chief Complaint  Patient presents with  . Shortness of Breath  . Nasal Congestion  . Sore Throat    HPI Patient reports sore throat in inability to swallow fluid and pills over the past 24 hours.  She was unable to keep any of her fluids down.  She does report some sore throat as well as some shortness of breath.  Family reports the patient has been having more difficulty swallowing bread and food over the past several weeks.  She discuss this with her primary care physician recommended that she no longer eat this food.  No prior history of esophageal dilations.  She has seeing someone from [facility] gastroenterology before.  She also reports some upper respiratory symptoms including chest congestion.   Past Medical History  Diagnosis Date  . Hypertension   . High cholesterol   . Spinal stenosis   . Osteoporosis   . Neuropathy   . Squamous cell skin cancer   . Overactive bladder   . Pelvic fracture    Past Surgical History  Procedure Laterality Date  . Tonsillectomy      20's   Family History  Problem Relation Age of Onset  . Heart failure Mother   . Diabetes Brother    History  Substance Use Topics  . Smoking status: Former Games developer  . Smokeless tobacco: Not on file  . Alcohol Use: No     Comment: Rare wine   OB History   Grav Para Term Preterm Abortions TAB SAB Ect Mult Living                 Review of Systems  All other systems reviewed and are negative.    Allergies  Penicillins  Home Medications   Current Outpatient Rx  Name  Route  Sig  Dispense  Refill  . alendronate (FOSAMAX) 70 MG tablet   Oral   Take 70 mg by mouth every 7 (seven) days. Take with a full glass of water on an empty stomach every Saturday         . amLODipine (NORVASC) 5 MG tablet   Oral   Take 5 mg by mouth every morning.          Marland Kitchen aspirin 81 MG chewable  tablet   Oral   Chew 81 mg by mouth every morning.          . cholecalciferol (VITAMIN D) 400 UNITS TABS tablet   Oral   Take 400 Units by mouth daily.         . ferrous sulfate 325 (65 FE) MG tablet   Oral   Take 325 mg by mouth daily with breakfast.         . HYDROcodone-acetaminophen (NORCO/VICODIN) 5-325 MG per tablet   Oral   Take 1 tablet by mouth 3 (three) times daily. 0900, 1300, 2000         . Multiple Vitamins-Minerals (ELLIS TONIC PO)   Oral   Take 15 mLs by mouth 2 (two) times daily.         . Protein (PROCEL) POWD   Oral   Take 1 packet by mouth 2 (two) times daily.          Marland Kitchen albuterol (PROVENTIL) (2.5 MG/3ML) 0.083% nebulizer solution   Nebulization   Take 2.5 mg by nebulization every 4 (  four) hours as needed for wheezing or shortness of breath.          . polyethylene glycol powder (GLYCOLAX/MIRALAX) powder   Oral   Take 1 Container by mouth daily as needed for mild constipation.          . silver sulfADIAZINE (SILVADENE) 1 % cream   Topical   Apply 1 application topically daily.   50 g   0    BP 117/55  Pulse 98  Temp(Src) 99.8 F (37.7 C) (Oral)  Resp 23  SpO2 96% Physical Exam  Nursing note and vitals reviewed. Constitutional: She is oriented to person, place, and time. She appears well-developed and well-nourished. No distress.  HENT:  Head: Normocephalic and atraumatic.  Eyes: EOM are normal.  Neck: Normal range of motion.  Cardiovascular: Normal rate, regular rhythm and normal heart sounds.   Pulmonary/Chest: Effort normal and breath sounds normal.  Abdominal: Soft. She exhibits no distension. There is no tenderness.  Musculoskeletal: Normal range of motion.  Neurological: She is alert and oriented to person, place, and time.  Skin: Skin is warm and dry.  Psychiatric: She has a normal mood and affect. Judgment normal.    ED Course  Procedures (including critical care time) Labs Review Labs Reviewed  BASIC METABOLIC  PANEL - Abnormal; Notable for the following:    Potassium 3.3 (*)    Glucose, Bld 114 (*)    GFR calc non Af Amer 73 (*)    GFR calc Af Amer 85 (*)    All other components within normal limits  CBC - Abnormal; Notable for the following:    Hemoglobin 11.5 (*)    HCT 35.0 (*)    RDW 18.0 (*)    All other components within normal limits  CULTURE, BLOOD (ROUTINE X 2)  CULTURE, BLOOD (ROUTINE X 2)   Imaging Review Dg Chest 2 View  01/31/2013   CLINICAL DATA:  Shortness of breath, nasal congestion, sore throat.  EXAM: CHEST  2 VIEW  COMPARISON:  10/05/2012  FINDINGS: The cardiac silhouette is within normal limits. There remain coarse interstitial markings diffusely throughout both lungs likely reflective of underlying chronic lung disease. Curvilinear density in the left lung base is unchanged from multiple prior studies. There is no evidence of new airspace consolidation, edema, pleural effusion, or pneumothorax. Mild biapical pleural thickening is noted. Extensive atherosclerotic vascular calcification is noted. No acute osseous abnormality is identified.  IMPRESSION: Stable appearance of the chest. No evidence of acute airspace disease.   Electronically Signed   By: Sebastian Ache   On: 01/31/2013 09:35  I personally reviewed the imaging tests through PACS system I reviewed available ER/hospitalization records through the EMR   EKG Interpretation   None       MDM  No diagnosis found. Inability to tolerate fluids in the emergency department.  Glucagon given without improvement in her symptoms.  This appears to be esophageal impaction.  GI consult.    Lyanne Co, MD 01/31/13 1124

## 2013-01-31 NOTE — Consult Note (Signed)
GI Consultation  Referring Provider: No ref. provider found Primary Care Physician:  Ezequiel Kayser, MD Primary Gastroenterologist:  Dr.  Jaquita Rector for Consultation:  *Unable to swallow food or liquid**  HPI: Ashlee [name] is a 79 y.o. female *with 24 hour history of inability to swallow liquids or solids.  She apparently has been having some difficulty over the past few weeks her she also complains of sore throat.  She has no prior history of GI complaints.  In the ER she was seen to immediately regurgitate liquids upon swallowing.**   Past Medical History  Diagnosis Date  . Hypertension   . High cholesterol   . Spinal stenosis   . Osteoporosis   . Neuropathy   . Squamous cell skin cancer   . Overactive bladder   . Pelvic fracture     Past Surgical History  Procedure Laterality Date  . Tonsillectomy      20's    Prior to Admission medications   Medication Sig Start Date End Date Taking? Authorizing Provider  alendronate (FOSAMAX) 70 MG tablet Take 70 mg by mouth every 7 (seven) days. Take with a full glass of water on an empty stomach every Saturday   Yes Historical Provider, MD  amLODipine (NORVASC) 5 MG tablet Take 5 mg by mouth every morning.    Yes Historical Provider, MD  aspirin 81 MG chewable tablet Chew 81 mg by mouth every morning.    Yes Historical Provider, MD  cholecalciferol (VITAMIN D) 400 UNITS TABS tablet Take 400 Units by mouth daily.   Yes Historical Provider, MD  ferrous sulfate 325 (65 FE) MG tablet Take 325 mg by mouth daily with breakfast.   Yes Historical Provider, MD  HYDROcodone-acetaminophen (NORCO/VICODIN) 5-325 MG per tablet Take 1 tablet by mouth 3 (three) times daily. 0900, 1300, 2000   Yes Historical Provider, MD  Multiple Vitamins-Minerals (ELLIS TONIC PO) Take 15 mLs by mouth 2 (two) times daily.   Yes Historical Provider, MD  Protein (PROCEL) POWD Take 1 packet by mouth 2 (two) times daily.    Yes Historical Provider, MD  albuterol  (PROVENTIL) (2.5 MG/3ML) 0.083% nebulizer solution Take 2.5 mg by nebulization every 4 (four) hours as needed for wheezing or shortness of breath.  11/28/12   Historical Provider, MD  polyethylene glycol powder (GLYCOLAX/MIRALAX) powder Take 1 Container by mouth daily as needed for mild constipation.  12/16/12   Historical Provider, MD  silver sulfADIAZINE (SILVADENE) 1 % cream Apply 1 application topically daily. 12/19/12   Alvan Dame, DPM    Current Facility-Administered Medications  Medication Dose Route Frequency Provider Last Rate Last Dose  . 0.9 %  sodium chloride infusion  1,000 mL Intravenous Continuous Lyanne Co, MD       Current Outpatient Prescriptions  Medication Sig Dispense Refill  . alendronate (FOSAMAX) 70 MG tablet Take 70 mg by mouth every 7 (seven) days. Take with a full glass of water on an empty stomach every Saturday      . amLODipine (NORVASC) 5 MG tablet Take 5 mg by mouth every morning.       Marland Kitchen aspirin 81 MG chewable tablet Chew 81 mg by mouth every morning.       . cholecalciferol (VITAMIN D) 400 UNITS TABS tablet Take 400 Units by mouth daily.      . ferrous sulfate 325 (65 FE) MG tablet Take 325 mg by mouth daily with breakfast.      .  HYDROcodone-acetaminophen (NORCO/VICODIN) 5-325 MG per tablet Take 1 tablet by mouth 3 (three) times daily. 0900, 1300, 2000      . Multiple Vitamins-Minerals (ELLIS TONIC PO) Take 15 mLs by mouth 2 (two) times daily.      . Protein (PROCEL) POWD Take 1 packet by mouth 2 (two) times daily.       Marland Kitchen albuterol (PROVENTIL) (2.5 MG/3ML) 0.083% nebulizer solution Take 2.5 mg by nebulization every 4 (four) hours as needed for wheezing or shortness of breath.       . polyethylene glycol powder (GLYCOLAX/MIRALAX) powder Take 1 Container by mouth daily as needed for mild constipation.       . silver sulfADIAZINE (SILVADENE) 1 % cream Apply 1 application topically daily.  50 g  0    Allergies as of 01/31/2013 - Review Complete  01/31/2013  Allergen Reaction Noted  . Penicillins Other (See Comments) 04/29/2011    Family History  Problem Relation Age of Onset  . Heart failure Mother   . Diabetes Brother     History   Social History  . Marital Status: Widowed    Spouse Name: N/A    Number of Children: 0  . Years of Education: N/A   Occupational History  . Retired Catering manager     Social History Main Topics  . Smoking status: Former Games developer  . Smokeless tobacco: Not on file  . Alcohol Use: No     Comment: Rare wine  . Drug Use: No  . Sexual Activity: Not on file   Other Topics Concern  . Not on file   Social History Narrative   Widowed.  Lives alone.  Ambulates with a walker.    Review of Systems: Pertinent positive and negative review of systems were noted in the above history of present illness section. All other review of systems were otherwise negative   Physical Exam: Vital signs in last 24 hours: Temp:  [99 F (37.2 C)-99.8 F (37.7 C)] 99.8 F (37.7 C) (12/27 1012) Pulse Rate:  [81-104] 98 (12/27 1105) Resp:  [18-23] 23 (12/27 1105) BP: (117-154)/(55-80) 117/55 mmHg (12/27 1105) SpO2:  [94 %-96 %] 96 % (12/27 1105)  General:   Frail, elderly female in no acute distress Head:  Normocephalic and atraumatic. Eyes:  Sclera clear, no icterus.   Conjunctiva pink. Ears:  Normal auditory acuity. Nose:  No deformity, discharge,  or lesions. Mouth:  No deformity or lesions.  Oropharynx pink & moist. Neck:  Supple; no masses or thyromegaly. Lungs:  Clear throughout to auscultation.   No wheezes, crackles, or rhonchi. No acute distress. Heart:  Regular rate  no clicks, rubs,  or gallops.  There is a 2/6 midsystolic murmur heard best at the left sternal border Abdomen:  Soft, nontender and nondistended. No masses, hepatosplenomegaly or hernias noted. Normal bowel sounds, without guarding, and without rebound.   Rectal:  Deferred until time of colonoscopy.   Msk:  Symmetrical without gross  deformities. Normal posture. Pulses:  Normal pulses noted. Extremities:  Without clubbing or edema. Neurologic:  Alert and  oriented x4;  grossly normal neurologically. Skin:  Intact without significant lesions or rashes. Cervical Nodes:  No significant cervical adenopathy. Psych:  Alert and cooperative. Normal mood and affect.  Intake/Output from previous day:   Intake/Output this shift:    Lab Results:  Recent Labs  01/31/13 0850  WBC 10.1  HGB 11.5*  HCT 35.0*  PLT 241   BMET  Recent Labs  01/31/13 0850  NA 135  K 3.3*  CL 98  CO2 26  GLUCOSE 114*  BUN 19  CREATININE 0.63  CALCIUM 9.2   LFT No results found for this basename: PROT, ALBUMIN, AST, ALT, ALKPHOS, BILITOT, BILIDIR, IBILI,  in the last 72 hours PT/INR No results found for this basename: LABPROT, INR,  in the last 72 hours Hepatitis Panel No results found for this basename: HEPBSAG, HCVAB, HEPAIGM, HEPBIGM,  in the last 72 hours Additional Labs **  Studies/Results: Dg Chest 2 View  01/31/2013   CLINICAL DATA:  Shortness of breath, nasal congestion, sore throat.  EXAM: CHEST  2 VIEW  COMPARISON:  10/05/2012  FINDINGS: The cardiac silhouette is within normal limits. There remain coarse interstitial markings diffusely throughout both lungs likely reflective of underlying chronic lung disease. Curvilinear density in the left lung base is unchanged from multiple prior studies. There is no evidence of new airspace consolidation, edema, pleural effusion, or pneumothorax. Mild biapical pleural thickening is noted. Extensive atherosclerotic vascular calcification is noted. No acute osseous abnormality is identified.  IMPRESSION: Stable appearance of the chest. No evidence of acute airspace disease.   Electronically Signed   By: Sebastian Ache   On: 01/31/2013 09:35    Active Problems:   * No active hospital problems. *   Recommendations - *endoscopy with extraction of foreign body**  Assessment - *food  impaction of the esophagus, probably secondary to strictureMolly Maduro D. Arlyce Dice, MD, [facility]  Gastroenterology [phone]    01/31/2013, 1:11 PM

## 2013-01-31 NOTE — ED Notes (Signed)
Pt reports that there is no change in throat from Glucagon administration will make [facility] MD aware.

## 2013-01-31 NOTE — ED Notes (Signed)
Phlebotomy at bedside.

## 2013-01-31 NOTE — H&P (Signed)
[facility] History and Physical  Ashlee [name] ZOX:[id number] DOB: Oct 21, 1914 DOA: 01/31/2013  Referring physician: ER physician. PCP: Ezequiel Kayser, MD  Specialists: Dr. Arlyce Dice.  Chief Complaint: Difficulty swallowing.  HPI: Ashlee [name] is a 79 y.o. female with history of hypertension hyperlipidemia started experiencing increasing difficulty swallowing for the last day or 2. Patient feels like she is having a lump in the throat with some sore throat I explained. After reaching ER patient was taken to EGD by Dr. Arlyce Dice which showed esophageal stricture with food impaction and erosive gastritis duodenal ulcer. Since patient still has difficulty swallowing Dr. Arlyce Dice wanted swallow evaluation and patient has been on med for further management. Patient denies any chest pain productive cough fever chills shortness of breath nausea vomiting abdominal pain diarrhea. In the ER patient was found to be mildly febrile but chest x-ray doesn't show any infiltrates. On exam throat does not have any features of pharyngitis.   Review of Systems: As presented in the history of presenting illness, rest negative.  Past Medical History  Diagnosis Date  . Hypertension   . High cholesterol   . Spinal stenosis   . Osteoporosis   . Neuropathy   . Squamous cell skin cancer   . Overactive bladder   . Pelvic fracture    Past Surgical History  Procedure Laterality Date  . Tonsillectomy      20's   Social History:  reports that she has quit smoking. She does not have any smokeless tobacco history on file. She reports that she does not drink alcohol or use illicit drugs. Can patient participate in ADLs? Yes.  Allergies  Allergen Reactions  . Penicillins Other (See Comments)    Reaction Unknown    Family History:  Family History  Problem Relation Age of Onset  . Heart failure Mother   . Diabetes Brother       Prior to Admission medications   Medication Sig Start Date End Date  Taking? Authorizing Provider  alendronate (FOSAMAX) 70 MG tablet Take 70 mg by mouth every 7 (seven) days. Take with a full glass of water on an empty stomach every Saturday   Yes Historical Provider, MD  amLODipine (NORVASC) 5 MG tablet Take 5 mg by mouth every morning.    Yes Historical Provider, MD  aspirin 81 MG chewable tablet Chew 81 mg by mouth every morning.    Yes Historical Provider, MD  cholecalciferol (VITAMIN D) 400 UNITS TABS tablet Take 400 Units by mouth daily.   Yes Historical Provider, MD  ferrous sulfate 325 (65 FE) MG tablet Take 325 mg by mouth daily with breakfast.   Yes Historical Provider, MD  HYDROcodone-acetaminophen (NORCO/VICODIN) 5-325 MG per tablet Take 1 tablet by mouth 3 (three) times daily. 0900, 1300, 2000   Yes Historical Provider, MD  Multiple Vitamins-Minerals (ELLIS TONIC PO) Take 15 mLs by mouth 2 (two) times daily.   Yes Historical Provider, MD  Protein (PROCEL) POWD Take 1 packet by mouth 2 (two) times daily.    Yes Historical Provider, MD  albuterol (PROVENTIL) (2.5 MG/3ML) 0.083% nebulizer solution Take 2.5 mg by nebulization every 4 (four) hours as needed for wheezing or shortness of breath.  11/28/12   Historical Provider, MD  esomeprazole (NEXIUM) 40 MG capsule Take 1 capsule (40 mg total) by mouth daily at 12 noon. 01/31/13   Louis Meckel, MD  polyethylene glycol powder (GLYCOLAX/MIRALAX) powder Take 1 Container by mouth daily as needed for mild constipation.  12/16/12  Historical Provider, MD  silver sulfADIAZINE (SILVADENE) 1 % cream Apply 1 application topically daily. 12/19/12   Alvan Dame, DPM    Physical Exam: Filed Vitals:   01/31/13 1625 01/31/13 1730 01/31/13 1835 01/31/13 1923  BP: 143/66 149/71 161/78 161/80  Pulse:  83 117 90  Temp:    99.2 F (37.3 C)  TempSrc:      Resp: 21  18 26   Height:      Weight:      SpO2: 92% 97% 100% 99%     General:  Well-developed and moderately nourished.  Eyes: Anicteric no  pallor.  ENT: No discharge from ears eyes nose mouth.  Neck: No mass felt.  Cardiovascular: S1-S2 heard.  Respiratory: No rhonchi crepitations.  Abdomen: Soft nontender bowel sounds present.  Skin: No rash.  Musculoskeletal: No edema.  Psychiatric: Appears normal.  Neurologic: Alert awake oriented to time place and person. Moves all extremities.  Labs on Admission:  Basic Metabolic Panel:  Recent Labs Lab 01/31/13 0850  NA 135  K 3.3*  CL 98  CO2 26  GLUCOSE 114*  BUN 19  CREATININE 0.63  CALCIUM 9.2   Liver Function Tests: No results found for this basename: AST, ALT, ALKPHOS, BILITOT, PROT, ALBUMIN,  in the last 168 hours No results found for this basename: LIPASE, AMYLASE,  in the last 168 hours No results found for this basename: AMMONIA,  in the last 168 hours CBC:  Recent Labs Lab 01/31/13 0850  WBC 10.1  HGB 11.5*  HCT 35.0*  MCV 81.0  PLT 241   Cardiac Enzymes: No results found for this basename: CKTOTAL, CKMB, CKMBINDEX, TROPONINI,  in the last 168 hours  BNP (last 3 results) No results found for this basename: PROBNP,  in the last 8760 hours CBG: No results found for this basename: GLUCAP,  in the last 168 hours  Radiological Exams on Admission: Dg Chest 2 View  01/31/2013   CLINICAL DATA:  Shortness of breath, nasal congestion, sore throat.  EXAM: CHEST  2 VIEW  COMPARISON:  10/05/2012  FINDINGS: The cardiac silhouette is within normal limits. There remain coarse interstitial markings diffusely throughout both lungs likely reflective of underlying chronic lung disease. Curvilinear density in the left lung base is unchanged from multiple prior studies. There is no evidence of new airspace consolidation, edema, pleural effusion, or pneumothorax. Mild biapical pleural thickening is noted. Extensive atherosclerotic vascular calcification is noted. No acute osseous abnormality is identified.  IMPRESSION: Stable appearance of the chest. No evidence of  acute airspace disease.   Electronically Signed   By: Sebastian Ache   On: 01/31/2013 09:35     Assessment/Plan Principal Problem:   Dysphagia Active Problems:   Food impaction of esophagus   Stricture and stenosis of esophagus   Duodenal ulcer   Acute esophagitis   1. Dysphagia - patient's dysphagia is mostly in the oropharynx area. At this time I have placed patient n.p.o. and requested swallow team evaluation. Since patient has mild fever and sore throat like feeling and have ordered strep throat and influenza PCR and Patient on Unasyn for now for possible aspiration. Gentle hydration. 2. Impacted food with erosive esophagitis and duodenal ulcer and esophagus stricture - patient has been placed on PPI. 3. Mild anemia - follow CBC. 4. Hypertension - since patient is n.p.o. patient has been placed on when necessary IV hydralazine for systolic blood pressure more than 160. 5. Hyperlipidemia - continue home medications when patient can take orally.  I have personally reviewed patient's old charts and labs. Reviewed patient's EGD reports an gastroenterologist recommendations.   Code Status: Full code.  Family Communication: None.  Disposition Plan: Admit for observation.    KAKRAKANDY,ARSHAD N. [facility] Pager [phone].  If 7PM-7AM, please contact night-coverage [email or web address] Password [facility] 01/31/2013, 7:42 PM

## 2013-01-31 NOTE — ED Notes (Signed)
Bed: WU98 Expected date:  Expected time:  Means of arrival:  Comments: Hold for endo pt

## 2013-01-31 NOTE — ED Notes (Signed)
August Saucer (sister in law) and POA. [phone]. Would like to be update or notified of any changes of pt.

## 2013-01-31 NOTE — Progress Notes (Signed)
The patient is still unable to swallow liquids or solids despite clearing the esophagus of a small amount of retained fluid. She clearly has problems with deglutition and needs a swallowing study.

## 2013-01-31 NOTE — ED Notes (Signed)
Pt from [facility] c/o of chest congestion and sore throat x 4 days, shortness of breath, vomiting and poor appetite since yesterday.  Staff reports that she also has had red speckles on left side.

## 2013-01-31 NOTE — Progress Notes (Signed)
Clinical Social Work Department BRIEF PSYCHOSOCIAL ASSESSMENT 01/31/2013  Patient:  Ashlee [name], Ashlee [name]     Account Number:  [id number]     Admit date:  01/31/2013  Clinical Social Worker:  Mariann Laster  Date/Time:  01/31/2013 01:39 PM  Referred by:  CSW  Date Referred:  01/31/2013 Referred for  Psychosocial assessment   Other Referral:   Pt is from [facility].   Interview type:  Patient Other interview type:   Pt's sister-in-law, Ashlee [name], was also at bedside.    PSYCHOSOCIAL DATA Living Status:  FACILITY Admitted from facility:  [facility] Level of care:  Assisted Living Primary support name:  Ashlee [name] Primary support relationship to patient:  FRIEND Degree of support available:   adequate. Ashlee [name] is pt's sister-in-law and was at bedside of pt and taking notes re: pt's care.    CURRENT CONCERNS Current Concerns  Other - See comment   Other Concerns:   Pt stated her chief concern was making sure all her MDs and caretakers here at the hospital and at [facility] are able to communicate with each other so she can get the best care possible.    SOCIAL WORK ASSESSMENT / PLAN CSW met with pt and pt's sister-in-law, Ashlee [name], at bedside. Pt is quite hard of hearing--she can hear best from her left ear.    Pt is resident at [facility]. Pt states she is happy with the home and that the staff is quite supportive of her. Pt states she has nephews that live in [place].    Pt stated her chief concern was making sure all her MDs and caretakers here at the hospital and at [facility] are able to communicate with each other so she can get the best care possible.   Assessment/plan status:  No Further Intervention Required Other assessment/ plan:   Pt to return to [facility] unless pt needs a different level of care--then she will have post-acute placement needs.   Information/referral to community resources:   none  at this time.    PATIENT'S/FAMILY'S RESPONSE TO PLAN OF CARE: Pt thanked CSW for her time. Pt states she is eager to be admitted so that "whatever is wrong can be fixed, because it's a been a miserable few days."   [place] Spaniel Hanover, [phone]     ED CSW  1:52pm

## 2013-01-31 NOTE — ED Provider Notes (Signed)
6:48 PM Accepted care from Dr. Patria Mane. Pt back from EGD w/ ongoing swallowing issues. GI recommends swallow study. I spoke w/ radiology who states this can be performed in the morning. Will admit for obs and IV hydration overnight.   Clinical Impression 1. Impacted esophageal foreign body, initial encounter   2. Acute esophagitis   3. Duodenal ulcer   4. Dysphagia      Junius Argyle, MD 02/01/13 [phone]

## 2013-01-31 NOTE — Op Note (Signed)
[facility] [address] [place], [address]   ENDOSCOPY PROCEDURE REPORT  PATIENT: Ashlee [name], Ashlee [name]  MR#: [id number] BIRTHDATE: April 09, 1914 , 89  yrs. old GENDER: Female ENDOSCOPIST: Louis Meckel, MD REFERRED BY: PROCEDURE DATE:  01/31/2013 PROCEDURE:  EGD w/ biopsy and EGD w/ fb removal ASA CLASS:     Class III INDICATIONS:  Foreign body removal from esophagus. MEDICATIONS: These medications were titrated to patient response per physician's verbal order and Versed-Detailed 1.5 mg IV TOPICAL ANESTHETIC: Cetacaine Spray  DESCRIPTION OF PROCEDURE: After the risks benefits and alternatives of the procedure were thoroughly explained, informed consent was obtained.  The Pentax Gastroscope [id number] endoscope was introduced through the mouth and advanced to the third portion of the duodenum. Without limitations.  The instrument was slowly withdrawn as the mucosa was fully examined.      In the distal esophagus there was a small amount of retained fluid and food.  A moderate stricture was seen at the GE junction.  The 9 mm scope easily traversed the stricture.  The food was pushed into the stomach. There were diffuse erosive changes throughout the lower third of the esophagus. In the duodenal bulb there was a superficial 3 mm ulcer. Surrounding mucosa was biopsied.   In the distal esophagus there was a small amount of retained fluid and food.  A moderate stricture was seen at the GE junction.  The 9 mm scope easily traversed the stricture.  The food was pushed into the stomach. There were diffuse erosive changes throughout the lower third of the esophagus. In the duodenal bulb there was a superficial 3 mm ulcer. Surrounding mucosa was biopsied.   The remainder of the upper endoscopy exam was otherwise normal.  Re    The scope was then withdrawn from the patient and the procedure completed.  COMPLICATIONS: There were no complications. ENDOSCOPIC  IMPRESSION: 1.  food impaction of the distal esophagus 2.  esophageal stricture 3.  erosive esophagitis 4.  duodenal ulcer  RECOMMENDATIONS: begin axial 40 mg daily Upper endoscopy with balloon dilatation in 2-3 weeks REPEAT EXAM:  eSigned:  Louis Meckel, MD 01/31/2013 4:13 PM   XB:[id number] Cyndia Bent, MD  PATIENT NAME:  [name], [name] MR#: [id number]

## 2013-01-31 NOTE — ED Notes (Signed)
Social work at bedside.  

## 2013-01-31 NOTE — ED Notes (Signed)
Attmepted to call report, Floor RN to call back

## 2013-02-01 LAB — CBC
Hemoglobin: 10.3 g/dL — ABNORMAL LOW (ref 12.0–15.0)
MCH: 26.4 pg (ref 26.0–34.0)
MCV: 80.8 fL (ref 78.0–100.0)
Platelets: 220 10*3/uL (ref 150–400)
RBC: 3.9 MIL/uL (ref 3.87–5.11)
WBC: 10.3 10*3/uL (ref 4.0–10.5)

## 2013-02-01 LAB — GLUCOSE, CAPILLARY
Glucose-Capillary: 104 mg/dL — ABNORMAL HIGH (ref 70–99)
Glucose-Capillary: 106 mg/dL — ABNORMAL HIGH (ref 70–99)
Glucose-Capillary: 110 mg/dL — ABNORMAL HIGH (ref 70–99)
Glucose-Capillary: 111 mg/dL — ABNORMAL HIGH (ref 70–99)

## 2013-02-01 LAB — TSH: TSH: 0.701 u[IU]/mL (ref 0.350–4.500)

## 2013-02-01 LAB — INFLUENZA PANEL BY PCR (TYPE A & B)
H1N1 flu by pcr: NOT DETECTED
Influenza A By PCR: NEGATIVE
Influenza B By PCR: NEGATIVE

## 2013-02-01 MED ORDER — POTASSIUM CHLORIDE 10 MEQ/100ML IV SOLN
10.0000 meq | INTRAVENOUS | Status: AC
  Administered 2013-02-01 (×6): 10 meq via INTRAVENOUS
  Filled 2013-02-01 (×6): qty 100

## 2013-02-01 MED ORDER — POTASSIUM CHLORIDE CRYS ER 20 MEQ PO TBCR: 40.0000 meq | EXTENDED_RELEASE_TABLET | Freq: Two times a day (BID) | ORAL | Status: DC

## 2013-02-01 NOTE — Progress Notes (Signed)
Lab called with a critical K level of 2.7. Texted paged MD with that info.

## 2013-02-01 NOTE — Progress Notes (Deleted)
[facility] lab called and reported that pt's wound culture grew MRSA. Texted paged that info to MD. 

## 2013-02-01 NOTE — Progress Notes (Signed)
Patient ID: Ashlee [name], female   DOB: 05/07/1914, 79 y.o.   MRN: [id number] [facility] PROGRESS NOTE  NAVEEN LORUSSO WUJ:[id number] DOB: [birth year] DOA: 01/31/2013 PCP: Ezequiel Kayser, MD  Brief narrative: 79 y.o. female with history of hypertension, hyperlipidemia, started experiencing increasing difficulty swallowing for the last day or 2. Patient feels like she is having a lump in the throat with some sore throat. After reaching ER patient was taken to EGD by Dr. Arlyce Dice which showed esophageal stricture with food impaction and erosive gastritis duodenal ulcer. Since patient still has difficulty swallowing Dr. Arlyce Dice wanted swallow evaluation and patient has been admitted to med floor for further management.   Principal Problem:   Dysphagia - unclear etiology - barium swallow scheduled for today  - will follow up on recommendations - keep NPO for now Active Problems:   Food impaction of esophagus - esophageal stricture noted on EGD - keep NPO until further evaluation   Duodenal ulcer - continue PPP IV   Hypokalemia - will supplement via IV route  - repeat BMP in AM     Anemia of chronic disease - slight drop since admission, likely dilutional - CBC in AM    HTN - reasonable inpatient control    Consultants:  None  Procedures/Studies: Dg Chest 2 View    01/31/2013   table appearance of the chest. No evidence of acute airspace disease.    Antibiotics:  Flagyl 12/27 -->  Code Status: Full Family Communication: Pt sand nephew at bedside Disposition Plan: Home when medically stable  HPI/Subjective: No events overnight.   Objective: Filed Vitals:   01/31/13 1923 01/31/13 1930 01/31/13 2230 02/01/13 0540  BP: 161/80 152/71 146/63 149/119  Pulse: 90 94 87 91  Temp: 99.2 F (37.3 C) 100.3 F (37.9 C) 98.9 F (37.2 C) 98.2 F (36.8 C)  TempSrc:  Oral Oral Oral  Resp: 26 16 20 20   Height:      Weight:      SpO2: 99% 92% 97% 97%    Intake/Output  Summary (Last 24 hours) at 02/01/13 1151 Last data filed at 02/01/13 0600  Gross per 24 hour  Intake 2123.75 ml  Output    650 ml  Net 1473.75 ml    Exam:   General:  Pt is alert, follows commands appropriately, not in acute distress  Cardiovascular: Regular rate and rhythm, S1/S2, no murmurs, no rubs, no gallops  Respiratory: Clear to auscultation bilaterally, no wheezing, no crackles, no rhonchi  Abdomen: Soft, non tender, non distended, bowel sounds present, no guarding  Extremities: No edema, pulses DP and PT palpable bilaterally  Neuro: Grossly nonfocal  Data Reviewed: Basic Metabolic Panel:  Recent Labs Lab 01/31/13 0850 02/01/13 0800  NA 135 135  K 3.3* 2.7*  CL 98 99  CO2 26 26  GLUCOSE 114* 109*  BUN 19 12  CREATININE 0.63 0.57  CALCIUM 9.2 8.5   Liver Function Tests:  Recent Labs Lab 02/01/13 0800  AST 19  ALT 11  ALKPHOS 59  BILITOT 0.6  PROT 6.5  ALBUMIN 3.3*   CBC:  Recent Labs Lab 01/31/13 0850 02/01/13 0800  WBC 10.1 10.3  HGB 11.5* 10.3*  HCT 35.0* 31.5*  MCV 81.0 80.8  PLT 241 220   CBG:  Recent Labs Lab 02/01/13 0007 02/01/13 0606  GLUCAP 111* 105*    Recent Results (from the past 240 hour(s))  RAPID STREP SCREEN     Status: None   Collection Time  01/31/13  8:57 PM      Result Value Range Status   Streptococcus, Group A Screen (Direct) NEGATIVE  NEGATIVE Final   Comment: (NOTE)     A Rapid Antigen test may result negative if the antigen level in the     sample is below the detection level of this test. The FDA has not     cleared this test as a stand-alone test therefore the rapid antigen     negative result has reflexed to a Group A Strep culture.  SURGICAL PCR SCREEN     Status: None   Collection Time    02/01/13 12:14 AM      Result Value Range Status   MRSA, PCR NEGATIVE  NEGATIVE Final   Staphylococcus aureus NEGATIVE  NEGATIVE Final   Comment:            The Xpert SA Assay (FDA     approved for NASAL  specimens     in patients over 8 years of age),     is one component of     a comprehensive surveillance     program.  Test performance has     been validated by [employer] for patients greater     than or equal to 58 year old.     It is not intended     to diagnose infection nor to     guide or monitor treatment.     Scheduled Meds: . antiseptic oral rinse  15 mL Mouth Rinse q12n4p  . chlorhexidine  15 mL Mouth Rinse BID  . enoxaparin (LOVENOX) injection  40 mg Subcutaneous Q24H  . metronidazole  500 mg Intravenous Q8H  . pantoprazole (PROTONIX) IV  40 mg Intravenous Q24H  . potassium chloride  10 mEq Intravenous Q1 Hr x 6   Continuous Infusions: . dextrose 5 % and 0.45 % NaCl with KCl 10 mEq/L 75 mL/hr at 01/31/13 2141   Debbora Presto, MD  [facility] Pager [phone]  If 7PM-7AM, please contact night-coverage [email or web address] Password [facility] 02/01/2013, 11:51 AM   LOS: 1 day

## 2013-02-01 NOTE — Evaluation (Signed)
Clinical/Bedside Swallow Evaluation Patient Details  Name: Ashlee [name] MRN: [id number] Date of Birth: 1914-03-28  Today's Date: 02/01/2013 Time: 4098-1191 SLP Time Calculation (min): 15 min  Past Medical History:  Past Medical History  Diagnosis Date  . Hypertension   . High cholesterol   . Spinal stenosis   . Osteoporosis   . Neuropathy   . Squamous cell skin cancer   . Overactive bladder   . Pelvic fracture    Past Surgical History:  Past Surgical History  Procedure Laterality Date  . Tonsillectomy      20's   HPI:  Ashlee [name] is a 79 y.o. female with history of hypertension hyperlipidemia started experiencing increasing difficulty swallowing for the last day or 2. Patient feels like she is having a lump in the throat with some sore throat I explained. After reaching ER patient was taken to EGD by Dr. Arlyce Dice which showed esophageal stricture with food impaction and erosive gastritis duodenal ulcer. Since patient still has difficulty swallowing Dr. Arlyce Dice wanted swallow evaluation and patient has been on med for further management. Patient denies any chest pain productive cough fever chills shortness of breath nausea vomiting abdominal pain diarrhea. In the ER patient was found to be mildly febrile but chest x-ray doesn't show any infiltrates. On exam throat does not have any features of pharyngitis. BSE indicated following results of EGD.    Assessment / Plan / Recommendation Clinical Impression  BSE completed but limited to ice chips x2 due to severity of suspected pharyngeal dysphagia.  Immediate expectoration of bolus with increased wet coughing requiring oral suction.  Volitional swallow functional with adequate hyoid laryngeal elevation upon palpation.  Patient reports severe pain during the swallow and  upon palpation.  Recommend to proceed with objective evaluation of MBS to assess risk for aspiration and  provide diagnostic information for POC.  MBS to be completed  02/02/13.      Aspiration Risk  Severe    Diet Recommendation NPO;Alternative means - temporary   Medication Administration: Via alternative means    Other  Recommendations Recommended Consults: MBS Oral Care Recommendations: Oral care Q4 per protocol   Follow Up Recommendations  Skilled Nursing facility    Frequency and Duration min 2x/week  2 weeks       SLP Swallow Goals  Pending results of MBS    Swallow Study Prior Functional Status  No prior reports of dysphagia     General Date of Onset: 01/31/13 HPI: Ashlee [name] is a 79 y.o. female with history of hypertension hyperlipidemia started experiencing increasing difficulty swallowing for the last day or 2. Patient feels like she is having a lump in the throat with some sore throat I explained. After reaching ER patient was taken to EGD by Dr. Arlyce Dice which showed esophageal stricture with food impaction and erosive gastritis duodenal ulcer. Since patient still has difficulty swallowing Dr. Arlyce Dice wanted swallow evaluation and patient has been on med for further management. Patient denies any chest pain productive cough fever chills shortness of breath nausea vomiting abdominal pain diarrhea. In the ER patient was found to be mildly febrile but chest x-ray doesn't show any infiltrates. On exam throat does not have any features of pharyngitis.  Type of Study: Bedside swallow evaluation Diet Prior to this Study: NPO Temperature Spikes Noted: No Respiratory Status: Nasal cannula History of Recent Intubation: No Behavior/Cognition: Alert;Cooperative;Pleasant mood;Requires cueing Oral Cavity - Dentition: Adequate natural dentition Self-Feeding Abilities: Able to feed self Patient Positioning:  Upright in bed Baseline Vocal Quality: Hoarse Volitional Cough: Strong;Wet Volitional Swallow: Able to elicit    Oral/Motor/Sensory Function Overall Oral Motor/Sensory Function: Appears within functional limits for tasks assessed   Ice  Chips Ice chips: Impaired Pharyngeal Phase Impairments: Suspected delayed Swallow;Throat Clearing - Immediate;Cough - Immediate;Wet Vocal Quality   Thin Liquid Thin Liquid: Not tested    Nectar Thick Nectar Thick Liquid: Not tested   Honey Thick Honey Thick Liquid: Not tested   Puree Puree: Not tested   Solid   GO    Solid: Not tested      Moreen Fowler MS, CCC-SLP [phone] [facility] 02/01/2013,7:49 PM

## 2013-02-01 NOTE — Progress Notes (Signed)
Utilization Review completed.  

## 2013-02-02 ENCOUNTER — Inpatient Hospital Stay (HOSPITAL_COMMUNITY): Payer: Medicare Other

## 2013-02-02 ENCOUNTER — Telehealth: Payer: Self-pay

## 2013-02-02 ENCOUNTER — Encounter (HOSPITAL_COMMUNITY)
Admission: EM | Disposition: A | Discharge status: Hospice | Payer: Self-pay | Source: Home / Self Care | Attending: Internal Medicine

## 2013-02-02 ENCOUNTER — Encounter (HOSPITAL_COMMUNITY): Payer: Self-pay | Admitting: Gastroenterology

## 2013-02-02 DIAGNOSIS — K222 Esophageal obstruction: Secondary | ICD-10-CM

## 2013-02-02 DIAGNOSIS — K449 Diaphragmatic hernia without obstruction or gangrene: Secondary | ICD-10-CM

## 2013-02-02 HISTORY — PX: ESOPHAGOGASTRODUODENOSCOPY (EGD) WITH ESOPHAGEAL DILATION: SHX5812

## 2013-02-02 LAB — BASIC METABOLIC PANEL
Calcium: 8.7 mg/dL (ref 8.4–10.5)
Chloride: 101 mEq/L (ref 96–112)
Creatinine, Ser: 0.62 mg/dL (ref 0.50–1.10)
GFR calc Af Amer: 85 mL/min — ABNORMAL LOW (ref >90)

## 2013-02-02 LAB — COMPREHENSIVE METABOLIC PANEL
AST: 19 U/L (ref 0–37)
BUN: 12 mg/dL (ref 6–23)
CO2: 26 mEq/L (ref 19–32)
Calcium: 8.5 mg/dL (ref 8.4–10.5)
Chloride: 99 mEq/L (ref 96–112)
Creatinine, Ser: 0.57 mg/dL (ref 0.50–1.10)
GFR calc Af Amer: 88 mL/min — ABNORMAL LOW (ref >90)
GFR calc non Af Amer: 76 mL/min — ABNORMAL LOW (ref >90)
Glucose, Bld: 109 mg/dL — ABNORMAL HIGH (ref 70–99)
Potassium: 2.7 mEq/L — CL (ref 3.5–5.1)
Total Bilirubin: 0.6 mg/dL (ref 0.3–1.2)

## 2013-02-02 LAB — CBC
MCH: 26.5 pg (ref 26.0–34.0)
MCV: 79.8 fL (ref 78.0–100.0)
Platelets: 234 10*3/uL (ref 150–400)
RDW: 17.8 % — ABNORMAL HIGH (ref 11.5–15.5)
WBC: 12.5 10*3/uL — ABNORMAL HIGH (ref 4.0–10.5)

## 2013-02-02 LAB — GLUCOSE, CAPILLARY
Glucose-Capillary: 92 mg/dL (ref 70–99)
Glucose-Capillary: 135 mg/dL — ABNORMAL HIGH (ref 70–99)

## 2013-02-02 SURGERY — ESOPHAGOGASTRODUODENOSCOPY (EGD) WITH ESOPHAGEAL DILATION
Anesthesia: Moderate Sedation

## 2013-02-02 MED ORDER — SODIUM CHLORIDE 0.9 % IV SOLN
INTRAVENOUS | Status: DC
  Administered 2013-02-02: 16:00:00 via INTRAVENOUS

## 2013-02-02 MED ORDER — FENTANYL CITRATE 0.05 MG/ML IJ SOLN
INTRAMUSCULAR | Status: DC | PRN
  Administered 2013-02-02: 25 ug via INTRAVENOUS

## 2013-02-02 MED ORDER — BUTAMBEN-TETRACAINE-BENZOCAINE 2-2-14 % EX AERO
INHALATION_SPRAY | CUTANEOUS | Status: DC | PRN
  Administered 2013-02-02: 2 via TOPICAL

## 2013-02-02 MED ORDER — SODIUM CHLORIDE 0.9 % IV BOLUS (SEPSIS): 500.0000 mL | Freq: Once | INTRAVENOUS | Status: DC

## 2013-02-02 MED ORDER — BOOST / RESOURCE BREEZE PO LIQD
1.0000 | Freq: Two times a day (BID) | ORAL | Status: DC
  Administered 2013-02-05 (×2): 1 via ORAL

## 2013-02-02 MED ORDER — MIDAZOLAM HCL 10 MG/2ML IJ SOLN
INTRAMUSCULAR | Status: AC
  Filled 2013-02-02: qty 2

## 2013-02-02 MED ORDER — DEXTROSE 5 % AND 0.45 % NACL IV BOLUS
500.0000 mL | Freq: Once | INTRAVENOUS | Status: AC
  Administered 2013-02-02: 500 mL via INTRAVENOUS

## 2013-02-02 MED ORDER — MIDAZOLAM HCL 10 MG/2ML IJ SOLN
INTRAMUSCULAR | Status: DC | PRN
  Administered 2013-02-02 (×2): 1 mg via INTRAVENOUS

## 2013-02-02 MED ORDER — FENTANYL CITRATE 0.05 MG/ML IJ SOLN
INTRAMUSCULAR | Status: AC
  Filled 2013-02-02: qty 2

## 2013-02-02 MED ORDER — KCL IN DEXTROSE-NACL 40-5-0.9 MEQ/L-%-% IV SOLN
INTRAVENOUS | Status: DC
  Administered 2013-02-02 – 2013-02-03 (×2): via INTRAVENOUS
  Filled 2013-02-02 (×3): qty 1000

## 2013-02-02 NOTE — Procedures (Signed)
Objective Swallowing Evaluation: Modified Barium Swallowing Study  Patient Details  Name: Ashlee [name] MRN: [id number] Date of Birth: Nov 27, 1914  Today's Date: 02/02/2013 Time: 8469-6295 SLP Time Calculation (min): 37 min  Past Medical History:  Past Medical History  Diagnosis Date  . Hypertension   . High cholesterol   . Spinal stenosis   . Osteoporosis   . Neuropathy   . Squamous cell skin cancer   . Overactive bladder   . Pelvic fracture    Past Surgical History:  Past Surgical History  Procedure Laterality Date  . Tonsillectomy      20's  . Esophagogastroduodenoscopy N/A 01/31/2013    Procedure: ESOPHAGOGASTRODUODENOSCOPY (EGD);  Surgeon: Louis Meckel, MD;  Location: [facility];  Service: Endoscopy;  Laterality: N/A;   HPI:  Ashlee [name] is a 79 y.o. female with history of hypertension hyperlipidemia started experiencing increasing difficulty swallowing for the last day or 2 with c/o globus and sore throat.  After reaching ER patient was taken to EGD by Dr. Arlyce Dice which showed esophageal stricture with food impaction and erosive gastritis duodenal ulcer. Since,  patient  has c/o continued difficulty swallowing. Patient has been on medication for further management. MD ordered a swallow evaluation with SLP. In the ER patient was found to be mildly febrile but chest x-ray doesn't show any infiltrates. On initial exam throat did not have any features of pharyngitis.     Assessment / Plan / Recommendation Clinical Impression  Dysphagia Diagnosis: Suspected primary esophageal dysphagia Clinical impression: Patient presents with a severe, suspected primary esophageal dysphagia, characterized by decreased UES relaxation resulting in limited clearance of bolus into esophagus, and severe pharyngeal residuals, which are aspirated during and post swallow despite what appears to be relatively intact hyo-laryngeal elevation/excursion and  patient's spontaneous attempts to clear  pharynx with dry swallows.  SLP provided max cueing for use of chin tuck posture which was successful in opening UES, allowing for clearance of bolus into the esophagus, decreased pharyngeal residuals, and protection of the airway across consistencies when utilized correctly and with controlled bolus sizes. AMS limiting patient's coordination of strategy and bolus size however significantly impact aspiration risk. Although patient senses aspiration and responds with strong cough, she is unable to mentally expel secretions and/or utilize strategies which would effectively clear the airway on her own. Aspiration of secretions likely. Given advanced age, unclear if further w/u of possible UES dysfunction would be warranted. Additionally, findings may be a result of known lower esophageal deficits noted on most recent EGD.  Paged MD without response. MD, may need to discuss with patient/family initiation of po diet with attempt of strict use of compensatory strategies and known risk of aspiration vs additional w/u vs temporary non-oral means of nutrition in hopes of some recovery over time. SLP will continue to f/u. If po diet with known risk is chosen, recommend dysphagia 1 (puree) with nectar thick liquids via tsp, full supervision and cueing for use of chin tuck and multiple swallows with each bite and sip.     Treatment Recommendation  Therapy as outlined in treatment plan below    Diet Recommendation Dysphagia 1 (Puree);Nectar-thick liquid (if chosen by family with known risk of aspiration)   Liquid Administration via: Spoon (teaspoon only) Medication Administration: Crushed with puree Supervision: Patient able to self feed;Full supervision/cueing for compensatory strategies Compensations: Slow rate;Small sips/bites;Multiple dry swallows after each bite/sip Postural Changes and/or Swallow Maneuvers: Chin tuck;Seated upright 90 degrees;Upright 30-60 min after meal  Other  Recommendations Oral Care  Recommendations: Oral care BID Other Recommendations: Order thickener from pharmacy;Prohibited food (jello, ice cream, thin soups);Remove water pitcher   Follow Up Recommendations  Skilled Nursing facility    Frequency and Duration min 3x week  2 weeks           General HPI: MARKETA [name] is a 79 y.o. female with history of hypertension hyperlipidemia started experiencing increasing difficulty swallowing for the last day or 2 with c/o globus and sore throat.  After reaching ER patient was taken to EGD by Dr. Arlyce Dice which showed esophageal stricture with food impaction and erosive gastritis duodenal ulcer. Since,  patient  has c/o continued difficulty swallowing. Patient has been on medication for further management. MD ordered a swallow evaluation with SLP. In the ER patient was found to be mildly febrile but chest x-ray doesn't show any infiltrates. On initial exam throat did not have any features of pharyngitis. Type of Study: Modified Barium Swallowing Study Reason for Referral: Objectively evaluate swallowing function Previous Swallow Assessment: bedside swallow evaluation complete 12/28 indicated severe dysphagia with recommendations for objective evaluation of swallowing Diet Prior to this Study: NPO Temperature Spikes Noted: No Respiratory Status: Room air History of Recent Intubation: No Behavior/Cognition: Alert;Cooperative;Pleasant mood;Confused;Requires cueing;Distractible;Decreased sustained attention;Hard of hearing Oral Cavity - Dentition: Adequate natural dentition Oral Motor / Sensory Function: Within functional limits Self-Feeding Abilities: Able to feed self;Needs assist Patient Positioning: Upright in chair Baseline Vocal Quality: Wet Volitional Cough: Strong;Wet Volitional Swallow: Able to elicit (with c/o pain) Anatomy: Other (Comment) (? osteophytes along c-spine, MD not present to confirm ) Pharyngeal Secretions: Not observed secondary MBS (however suspect due  to wet vocal quality at baseline)    Reason for Referral Objectively evaluate swallowing function   Oral Phase Oral Preparation/Oral Phase Oral Phase: WFL   Pharyngeal Phase Pharyngeal Phase Pharyngeal Phase: Impaired Pharyngeal - Honey Pharyngeal - Honey Teaspoon: Delayed swallow initiation;Premature spillage to valleculae;Pharyngeal residue - valleculae;Pharyngeal residue - pyriform sinuses;Penetration/Aspiration during swallow;Penetration/Aspiration after swallow;Significant aspiration (Amount);Pharyngeal residue - cp segment;Pharyngeal residue - posterior pharnyx;Compensatory strategies attempted (Comment) Penetration/Aspiration details (honey teaspoon): Material enters airway, passes BELOW cords and not ejected out despite cough attempt by patient Pharyngeal - Nectar Pharyngeal - Nectar Teaspoon: Delayed swallow initiation;Premature spillage to valleculae;Pharyngeal residue - valleculae;Pharyngeal residue - pyriform sinuses;Penetration/Aspiration during swallow;Penetration/Aspiration after swallow;Significant aspiration (Amount);Pharyngeal residue - cp segment;Pharyngeal residue - posterior pharnyx;Compensatory strategies attempted (Comment) Penetration/Aspiration details (nectar teaspoon): Material enters airway, passes BELOW cords and not ejected out despite cough attempt by patient Pharyngeal - Nectar Straw: Delayed swallow initiation;Premature spillage to valleculae;Pharyngeal residue - valleculae;Pharyngeal residue - pyriform sinuses;Penetration/Aspiration during swallow;Penetration/Aspiration after swallow;Significant aspiration (Amount);Pharyngeal residue - cp segment;Pharyngeal residue - posterior pharnyx;Compensatory strategies attempted (Comment) Penetration/Aspiration details (nectar straw): Material enters airway, passes BELOW cords and not ejected out despite cough attempt by patient Pharyngeal - Thin Pharyngeal - Thin Teaspoon: Delayed swallow initiation;Premature spillage to  valleculae;Pharyngeal residue - valleculae;Pharyngeal residue - pyriform sinuses;Penetration/Aspiration during swallow;Penetration/Aspiration after swallow;Significant aspiration (Amount);Pharyngeal residue - cp segment;Pharyngeal residue - posterior pharnyx;Compensatory strategies attempted (Comment) Penetration/Aspiration details (thin teaspoon): Material enters airway, passes BELOW cords and not ejected out despite cough attempt by patient Pharyngeal - Thin Straw: Delayed swallow initiation;Pharyngeal residue - valleculae;Pharyngeal residue - pyriform sinuses;Penetration/Aspiration during swallow;Penetration/Aspiration after swallow;Significant aspiration (Amount);Pharyngeal residue - cp segment;Pharyngeal residue - posterior pharnyx;Compensatory strategies attempted (Comment);Premature spillage to pyriform sinuses Penetration/Aspiration details (thin straw): Material enters airway, passes BELOW cords and not ejected out despite cough attempt by patient Pharyngeal -  Solids Pharyngeal - Puree: Delayed swallow initiation;Premature spillage to valleculae;Pharyngeal residue - valleculae;Pharyngeal residue - pyriform sinuses;Penetration/Aspiration during swallow;Penetration/Aspiration after swallow;Pharyngeal residue - cp segment;Pharyngeal residue - posterior pharnyx;Compensatory strategies attempted (Comment);Moderate aspiration Penetration/Aspiration details (puree): Material enters airway, passes BELOW cords and not ejected out despite cough attempt by patient  Cervical Esophageal Phase    GO    Cervical Esophageal Phase Cervical Esophageal Phase: Impaired Cervical Esophageal Phase - Honey Honey Teaspoon: Reduced cricopharyngeal relaxation Cervical Esophageal Phase - Nectar Nectar Teaspoon: Reduced cricopharyngeal relaxation Nectar Straw: Reduced cricopharyngeal relaxation Cervical Esophageal Phase - Thin Thin Teaspoon: Reduced cricopharyngeal relaxation Thin Straw: Reduced cricopharyngeal  relaxation Cervical Esophageal Phase - Solids Puree: Reduced cricopharyngeal relaxation        Ferdinand Lango MA, CCC-SLP [phone]  Abagayle Klutts Meryl 02/02/2013, 9:58 AM

## 2013-02-02 NOTE — Progress Notes (Signed)
INITIAL NUTRITION ASSESSMENT  DOCUMENTATION CODES Per approved criteria  -Not Applicable   INTERVENTION: - Diet advancement per MD/SLP - Resource Breeze BID - Will continue to monitor   NUTRITION DIAGNOSIS: Inadequate oral intake related to clear liquid diet as evidenced by diet order.   Goal: Advance diet as tolerated to diet per SLP/MD  Monitor:  Weights, labs, diet advancement, swallowing function  Reason for Assessment: Malnutrition screening tool   79 y.o. female  Admitting Dx: Dysphagia  ASSESSMENT: Pt from [facility], admitted with 4 days of sore throat, shortness of breath, vomiting, and poor appetite. Pt reported on admission inability to swallow fluid and pills x 24 hours. Family reported pt with more difficulty swallowing bread and food over the past several weeks. Pt found to have food impaction of esophagus. Had EGD 12/27. Followed by SLP, had MBS today - noted pt with severe, suspected primary esophageal dysphagia with recommendations for dysphagia 1 (puree) diet with nectar thick liquids. Pt alone in room during visit. Noted pt combative earlier this morning. Past weight trend indicates pt's weight has been relatively stable the past several months.   Potassium low, getting IV replacement.   Height: Ht Readings from Last 1 Encounters:  01/31/13 4\' 10"  (1.473 m)    Weight: Wt Readings from Last 1 Encounters:  01/31/13 117 lb (53.071 kg)    Ideal Body Weight: 97 lb  % Ideal Body Weight: 121%  Wt Readings from Last 10 Encounters:  01/31/13 117 lb (53.071 kg)  01/31/13 117 lb (53.071 kg)  12/19/12 114 lb 3.2 oz (51.801 kg)  10/16/12 114 lb 12.8 oz (52.073 kg)  10/04/12 114 lb 3.2 oz (51.8 kg)  10/02/12 112 lb 3.4 oz (50.9 kg)    Usual Body Weight: 114 lb last month  % Usual Body Weight: 103%  BMI:  Body mass index is 24.46 kg/(m^2).  Estimated Nutritional Needs: Kcal: 1610-9604 Protein: 65-80g Fluid: 1.3-1.5L/day  Skin:  Intact   Diet Order: NPO  EDUCATION NEEDS: -No education needs identified at this time   Intake/Output Summary (Last 24 hours) at 02/02/13 1135 Last data filed at 02/02/13 1000  Gross per 24 hour  Intake 796.25 ml  Output    500 ml  Net 296.25 ml    Last BM: 12/26  Labs:   Recent Labs Lab 01/31/13 0850 02/01/13 0800  NA 135 135  K 3.3* 2.7*  CL 98 99  CO2 26 26  BUN 19 12  CREATININE 0.63 0.57  CALCIUM 9.2 8.5  GLUCOSE 114* 109*    CBG (last 3)   Recent Labs  02/01/13 1751 02/01/13 2330 02/02/13 0722  GLUCAP 106* 104* 92    Scheduled Meds: . antiseptic oral rinse  15 mL Mouth Rinse q12n4p  . chlorhexidine  15 mL Mouth Rinse BID  . enoxaparin (LOVENOX) injection  40 mg Subcutaneous Q24H  . metronidazole  500 mg Intravenous Q8H  . pantoprazole (PROTONIX) IV  40 mg Intravenous Q24H    Continuous Infusions:   Past Medical History  Diagnosis Date  . Hypertension   . High cholesterol   . Spinal stenosis   . Osteoporosis   . Neuropathy   . Squamous cell skin cancer   . Overactive bladder   . Pelvic fracture     Past Surgical History  Procedure Laterality Date  . Tonsillectomy      20's  . Esophagogastroduodenoscopy N/A 01/31/2013    Procedure: ESOPHAGOGASTRODUODENOSCOPY (EGD);  Surgeon: Louis Meckel, MD;  Location: [facility];  Service: Endoscopy;  Laterality: N/A;    Levon Hedger MS, RD, LDN [phone] Pager [phone] After Hours Pager

## 2013-02-02 NOTE — Op Note (Signed)
[facility] [address] [place], [address]   ENDOSCOPY PROCEDURE REPORT  PATIENT: Ashlee, [name]  MR#: [id number] BIRTHDATE: 09-01-1914 , 89  yrs. old GENDER: Female ENDOSCOPIST: Iva Boop, MD, [facility] ASSISTANT:   Karie Soda, technician Nada Libman, RN PROCEDURE DATE:  02/02/2013 PROCEDURE:   Esophagoscopy   and balloon dilation < 30 mm ASA CLASS:   Class III INDICATIONS:dysphagia. MEDICATIONS: Fentanyl 25 mcg IV and Versed 2 mg IV TOPICAL ANESTHETIC:   Cetacaine Spray  DESCRIPTION OF PROCEDURE:   After the risks benefits and alternatives of the procedure were thoroughly explained, informed consent was obtained.  The     endoscope was introduced through the mouth  and advanced to the stomach antrum ,      The instrument was slowly withdrawn as the mucosa was carefully examined.  There was slight barium residue on epiglottis, larynx clear. The residue was suctioned. The UES was stenotic. The esophagus appeared relatively flaccid. There was a suggestion of a ring at GE junction, no esophagitis. A 10+ cm hiatus hernia was seen. Othersise normal exam to antrum. Retroflexion done.   Dilation was then performed at the GE junction but no impact so the UES and proximal esophagus was dilated to 15 mm no heme.  Dilator:Balloon Size:15-18  Reststance:none Heme:none  No impact on GE junction at 18 mm. 15 mm dilation of UES perfromed - no trauma/heme.  COMPLICATIONS: There were no complications. ENDOSCOPIC IMPRESSION: 1.   Proximal esophageal stenosis - likely from severe cervical DJD (known by CT earlier 2014) 2.   Hiatus hernia - very large 10+ cm 3.   Poor esophgeal motility suspected  RECOMMENDATIONS: Start dysphagia 1 diet with nectar thick liquids and maneuvers to reduce risk of aspiration.  Try to get back to usual surroundings soon as she is sundowning. We will follow-up tomorrow. Will cancel the Jan EGD w/ Dr.  Arlyce Dice. Stay on PPI - change  to oral when possible  eSigned:  Iva Boop, MD, [facility] 02/02/2013 3:31 PM

## 2013-02-02 NOTE — Progress Notes (Signed)
Speech Language Pathology Treatment: Dysphagia  Patient Details Name: Ashlee [name] MRN: [id number] DOB: 07/12/1914 Today's Date: 02/02/2013 Time: 4098-1191 SLP Time Calculation (min): 10 min  Assessment / Plan / Recommendation Clinical Impression  MBS planned for this am, however patient initially refusing exam. Treatment focused on providing education regarding results of initial bedside swallow evaluation and rationale for MBS. Patient demonstrating poor pharyngeal management of secretions as evidenced by wet vocal quality and continuous coughing at bedside. Max cueing provided for hard cough with expectoration of secretions resulting in a clearing of vocal quality. Suspect an esophageal component to swallowing dysfunction based on admission diagnosis and EGD findings. With this SLP, patient agreeable to proceeding with MBS. Radiology made aware. Will proceed with MBS this am.    HPI HPI: Ashlee [name] is a 79 y.o. female with history of hypertension hyperlipidemia started experiencing increasing difficulty swallowing for the last day or 2. Patient feels like she is having a lump in the throat with some sore throat I explained. After reaching ER patient was taken to EGD by Dr. Arlyce Dice which showed esophageal stricture with food impaction and erosive gastritis duodenal ulcer. Since patient still has difficulty swallowing Dr. Arlyce Dice wanted swallow evaluation and patient has been on med for further management. Patient denies any chest pain productive cough fever chills shortness of breath nausea vomiting abdominal pain diarrhea. In the ER patient was found to be mildly febrile but chest x-ray doesn't show any infiltrates. On exam throat does not have any features of pharyngitis.       SLP Plan  MBS    Recommendations Diet recommendations: NPO Medication Administration: Via alternative means              Oral Care Recommendations: Oral care Q4 per protocol Follow up Recommendations:  Skilled Nursing facility Plan: MBS    GO    Ferdinand Lango MA, CCC-SLP [phone]  Ferdinand Lango Meryl 02/02/2013, 8:44 AM

## 2013-02-02 NOTE — Progress Notes (Signed)
CSW assisting with d/c planning. Met with pt/sister in-law [name] Piephoff ) to offer support. Family would like pt to return to [facility] following hospital d/c. ALF will need to send a nsg to screen pt prior to pt returning to [facility]. CSW will assist with SNF placement if ALF is unable to manage pt's care. CSW will continue to follow.  Cori Razor LCSW [phone]

## 2013-02-02 NOTE — Progress Notes (Signed)
Pt becoming combative again.  Refusing blood draw, blood pressure, and CBG, and does not want anyone touching her.  She is able to answer all orientation questions correctly.  Will continue to monitor.  Sherron Monday

## 2013-02-02 NOTE — Progress Notes (Signed)
Pt's evening blood pressure was 170/69.  PRN order for hydralazine 10mg  for SBP over 160.  Hydralazine given.  Pt's blood pressure recheck after 30 minutes was 139/65.  However, at the same time, pt lost most orientation.  Pt knows her name, but does not know where she is, why she is here, or the date.  She is also becoming more combative.  Got her CBG and blood pressure recheck, but pt was unwilling at first.  Pt states that she does not trust me, and that she is not at [facility].  Pt is not attempting to get out of bed or harm herself or others.  Will continue to monitor.  Sherron Monday

## 2013-02-02 NOTE — Progress Notes (Signed)
Pt now reoriented.  Pt states that she is sorry, she thought she had been kidnapped.  Asked pt all orientation questions.  All questions answered correctly and without hesitation.  Pt had earlier stated that she saw 'ghosts in the room'.  Pt states now that she just heard a lot of voices, but did not see anything.  Will continue to monitor.  Ashlee [name]

## 2013-02-02 NOTE — Telephone Encounter (Signed)
Procedure cancelled with Noreene Larsson at [facility].

## 2013-02-02 NOTE — Progress Notes (Signed)
Patient ID: Ashlee [name], female   DOB: 1914-02-19, 79 y.o.   MRN: [id number]  [facility] PROGRESS NOTE  KALEEN ROCHETTE WUJ:[id number] DOB: 01/29/1915 DOA: 01/31/2013 PCP: Ezequiel Kayser, MD  Brief narrative:  79 y.o. female with history of hypertension, hyperlipidemia, started experiencing increasing difficulty swallowing for the last day or 2. Patient feels like she is having a lump in the throat with some sore throat. After reaching ER patient was taken to EGD by Dr. Arlyce Dice which showed esophageal stricture with food impaction and erosive gastritis duodenal ulcer. Since patient still has difficulty swallowing Dr. Arlyce Dice wanted swallow evaluation and patient has been admitted to med floor for further management.   Principal Problem:  Dysphagia  - severe and most likely secondary to stricture and ? Erosive gatritis - barium swallow scheduled for today  - will follow up on recommendations  - keep NPO for now  - will call GI for further recommendations  Active Problems:  Food impaction of esophagus  - esophageal stricture noted on EGD  - keep NPO until further evaluation  Duodenal ulcer  - continue PPP IV  Hypokalemia  - supplemented via IV route 12/28 but pt refusing blood work this AM - will try again if pt allows  - repeat BMP in AM  Anemia of chronic disease  - slight drop since admission, likely dilutional  - CBC pending this AM as pt refused blood draws  HTN  - reasonable inpatient control   Consultants:  GI Procedures/Studies:  Dg Chest 2 View 01/31/2013 table appearance of the chest. No evidence of acute airspace disease.  Antibiotics:  Flagyl 12/27 --> 12/29  Code Status: Full  Family Communication: Pt sand nephew at bedside  Disposition Plan: PT evaluation pending    HPI/Subjective: No events overnight.   Objective: Filed Vitals:   02/01/13 1355 02/01/13 1457 02/01/13 2200 02/01/13 2330  BP: 162/74 163/78 170/69 139/65  Pulse: 70 81 99   Temp:  98.6 F (37 C)  98.9 F (37.2 C)   TempSrc: Oral  Oral   Resp: 16  20   Height:      Weight:      SpO2: 99%  96%     Intake/Output Summary (Last 24 hours) at 02/02/13 0606 Last data filed at 02/01/13 2200  Gross per 24 hour  Intake 996.25 ml  Output    500 ml  Net 496.25 ml    Exam:   General:  Pt is alert, confused this AM, not following commands   Cardiovascular: Regular rate and rhythm, S1/S2, no murmurs, no rubs, no gallops  Respiratory: Rhonchi bilaterally with bibasilar crackles   Abdomen: Soft, non tender, non distended, bowel sounds present, no guarding  Extremities: No edema, pulses DP and PT palpable bilaterally  Data Reviewed: Basic Metabolic Panel:  Recent Labs Lab 01/31/13 0850 02/01/13 0800  NA 135 135  K 3.3* 2.7*  CL 98 99  CO2 26 26  GLUCOSE 114* 109*  BUN 19 12  CREATININE 0.63 0.57  CALCIUM 9.2 8.5   Liver Function Tests:  Recent Labs Lab 02/01/13 0800  AST 19  ALT 11  ALKPHOS 59  BILITOT 0.6  PROT 6.5  ALBUMIN 3.3*   CBC:  Recent Labs Lab 01/31/13 0850 02/01/13 0800  WBC 10.1 10.3  HGB 11.5* 10.3*  HCT 35.0* 31.5*  MCV 81.0 80.8  PLT 241 220   CBG:  Recent Labs Lab 02/01/13 0007 02/01/13 0606 02/01/13 1209 02/01/13 1751 02/01/13 2330  GLUCAP 111* 105* 110* 106* 104*    Recent Results (from the past 240 hour(s))  CULTURE, BLOOD (ROUTINE X 2)     Status: None   Collection Time    01/31/13  9:48 AM      Result Value Range Status   Specimen Description BLOOD RIGHT ARM  5 ML IN [facility] BOTTLE   Final   Special Requests NONE   Final   Culture  Setup Time     Final   Value: 01/31/2013 15:31     Performed at [facility]   Culture     Final   Value:        BLOOD CULTURE RECEIVED NO GROWTH TO DATE CULTURE WILL BE HELD FOR 5 DAYS BEFORE ISSUING A FINAL NEGATIVE REPORT     Performed at [facility]   Report Status PENDING   Incomplete  CULTURE, BLOOD (ROUTINE X 2)     Status: None   Collection Time     01/31/13  9:52 AM      Result Value Range Status   Specimen Description BLOOD RIGHT FOREARM  5 ML IN [facility] BOTTLE   Final   Special Requests NONE   Final   Culture  Setup Time     Final   Value: 01/31/2013 15:30     Performed at [facility]   Culture     Final   Value:        BLOOD CULTURE RECEIVED NO GROWTH TO DATE CULTURE WILL BE HELD FOR 5 DAYS BEFORE ISSUING A FINAL NEGATIVE REPORT     Performed at [facility]   Report Status PENDING   Incomplete  RAPID STREP SCREEN     Status: None   Collection Time    01/31/13  8:57 PM      Result Value Range Status   Streptococcus, Group A Screen (Direct) NEGATIVE  NEGATIVE Final   Comment: (NOTE)     A Rapid Antigen test may result negative if the antigen level in the     sample is below the detection level of this test. The FDA has not     cleared this test as a stand-alone test therefore the rapid antigen     negative result has reflexed to a Group A Strep culture.  CULTURE, GROUP A STREP     Status: None   Collection Time    01/31/13  8:57 PM      Result Value Range Status   Specimen Description THROAT   Final   Special Requests NONE   Final   Culture     Final   Value: Culture reincubated for better growth     Performed at [facility]   Report Status PENDING   Incomplete  SURGICAL PCR SCREEN     Status: None   Collection Time    02/01/13 12:14 AM      Result Value Range Status   MRSA, PCR NEGATIVE  NEGATIVE Final   Staphylococcus aureus NEGATIVE  NEGATIVE Final   Comment:            The Xpert SA Assay (FDA     approved for NASAL specimens     in patients over 20 years of age),     is one component of     a comprehensive surveillance     program.  Test performance has     been validated by [employer] for patients greater  than or equal to 73 year old.     It is not intended     to diagnose infection nor to     guide or monitor treatment.     Scheduled Meds: . antiseptic oral rinse   15 mL Mouth Rinse q12n4p  . chlorhexidine  15 mL Mouth Rinse BID  . enoxaparin (LOVENOX) injection  40 mg Subcutaneous Q24H  . metronidazole  500 mg Intravenous Q8H  . pantoprazole (PROTONIX) IV  40 mg Intravenous Q24H   Continuous Infusions:    Debbora Presto, MD  [facility] Pager [phone]  If 7PM-7AM, please contact night-coverage [email or web address] Password [email or web address] 02/02/2013, 6:06 AM   LOS: 2 days

## 2013-02-02 NOTE — Telephone Encounter (Signed)
Patient is currently admitted to [facility].  She needs EGD dilation per Dr. Arlyce Dice.  She is scheduled for 02/25/13 12:30 at [facility].  New instructions faxed to [phone].  Willette Cluster RNP will give the patient her instructions.

## 2013-02-02 NOTE — Progress Notes (Signed)
Progress Note   Subjective  confused today, [place]   Objective   Vital signs in last 24 hours: Temp:  [98.1 F (36.7 C)-99.3 F (37.4 C)] 99.3 F (37.4 C) (12/29 1100) Pulse Rate:  [70-109] 109 (12/29 0654) Resp:  [16-20] 18 (12/29 0622) BP: (119-170)/(65-91) 119/91 mmHg (12/29 0622) SpO2:  [94 %-99 %] 94 % (12/29 0654) Last BM Date: 01/30/13 General:    white female in NAD Lungs: Respirations even and unlabored. Congested lung sounds anterior chest, no wheezing.  Abdomen:  Soft, nontender and nondistended. Normal bowel sounds. Extremities:  Without edema. Neurologic:  Alert, confused Psych:  Cooperative. Normal mood and affect.   Lab Results:  Recent Labs  01/31/13 0850 02/01/13 0800 02/02/13 1159  WBC 10.1 10.3 12.5*  HGB 11.5* 10.3* 11.3*  HCT 35.0* 31.5* 34.0*  PLT 241 220 234   BMET  Recent Labs  01/31/13 0850 02/01/13 0800 02/02/13 1159  NA 135 135 137  K 3.3* 2.7* 3.1*  CL 98 99 101  CO2 26 26 18*  GLUCOSE 114* 109* 133*  BUN 19 12 14   CREATININE 0.63 0.57 0.62  CALCIUM 9.2 8.5 8.7   LFT  Recent Labs  02/01/13 0800  PROT 6.5  ALBUMIN 3.3*  AST 19  ALT 11  ALKPHOS 59  BILITOT 0.6    Studies/Results: Dg Swallowing Func-speech Pathology  02/02/2013   Ashlee [name], CCC-SLP     02/02/2013 10:02 AM Objective Swallowing Evaluation: Modified Barium Swallowing Study   Patient Details  Name: Ashlee [name] MRN: [id number] Date of Birth: Mar 19, 1914  Today's Date: 02/02/2013 Time: 4098-1191 SLP Time Calculation (min): 37 min  Past Medical History:  Past Medical History  Diagnosis Date  . Hypertension   . High cholesterol   . Spinal stenosis   . Osteoporosis   . Neuropathy   . Squamous cell skin cancer   . Overactive bladder   . Pelvic fracture    Past Surgical History:  Past Surgical History  Procedure Laterality Date  . Tonsillectomy      20's  . Esophagogastroduodenoscopy N/A 01/31/2013    Procedure:  ESOPHAGOGASTRODUODENOSCOPY (EGD);  Surgeon: Louis Meckel, MD;  Location: [facility];  Service: Endoscopy;   Laterality: N/A;   HPI:  Ashlee [name] is a 79 y.o. female with history of hypertension  hyperlipidemia started experiencing increasing difficulty  swallowing for the last day or 2 with c/o globus and sore throat.   After reaching ER patient was taken to EGD by Dr. Arlyce Dice which  showed esophageal stricture with food impaction and erosive  gastritis duodenal ulcer. Since,  patient  has c/o continued  difficulty swallowing. Patient has been on medication for further  management. MD ordered a swallow evaluation with SLP. In the ER  patient was found to be mildly febrile but chest x-ray doesn't  show any infiltrates. On initial exam throat did not have any  features of pharyngitis.     Assessment / Plan / Recommendation Clinical Impression  Dysphagia Diagnosis: Suspected primary esophageal dysphagia Clinical impression: Patient presents with a severe, suspected  primary esophageal dysphagia, characterized by decreased UES  relaxation resulting in limited clearance of bolus into  esophagus, and severe pharyngeal residuals, which are aspirated  during and post swallow despite what appears to be relatively  intact hyo-laryngeal elevation/excursion and  patient's  spontaneous attempts to clear pharynx with dry swallows.  SLP  provided max cueing for use  of chin tuck posture which was  successful in opening UES, allowing for clearance of bolus into  the esophagus, decreased pharyngeal residuals, and protection of  the airway across consistencies when utilized correctly and with  controlled bolus sizes. AMS limiting patient's coordination of  strategy and bolus size however significantly impact aspiration  risk. Although patient senses aspiration and responds with strong  cough, she is unable to mentally expel secretions and/or utilize  strategies which would effectively clear the airway on her own.  Aspiration  of secretions likely. Given advanced age, unclear if  further w/u of possible UES dysfunction would be warranted.  Additionally, findings may be a result of known lower esophageal  deficits noted on most recent EGD.  Paged MD without response.  MD, may need to discuss with patient/family initiation of po diet  with attempt of strict use of compensatory strategies and known  risk of aspiration vs additional w/u vs temporary non-oral means  of nutrition in hopes of some recovery over time. SLP will  continue to f/u. If po diet with known risk is chosen, recommend  dysphagia 1 (puree) with nectar thick liquids via tsp, full  supervision and cueing for use of chin tuck and multiple swallows  with each bite and sip.     Treatment Recommendation  Therapy as outlined in treatment plan below    Diet Recommendation Dysphagia 1 (Puree);Nectar-thick liquid (if  chosen by family with known risk of aspiration)   Liquid Administration via: Spoon (teaspoon only) Medication Administration: Crushed with puree Supervision: Patient able to self feed;Full supervision/cueing  for compensatory strategies Compensations: Slow rate;Small sips/bites;Multiple dry swallows  after each bite/sip Postural Changes and/or Swallow Maneuvers: Chin tuck;Seated  upright 90 degrees;Upright 30-60 min after meal    Other  Recommendations Oral Care Recommendations: Oral care BID Other Recommendations: Order thickener from pharmacy;Prohibited  food (jello, ice cream, thin soups);Remove water pitcher   Follow Up Recommendations  Skilled Nursing facility    Frequency and Duration min 3x week  2 weeks           General HPI: Ashlee [name] is a 79 y.o. female with history of  hypertension hyperlipidemia started experiencing increasing  difficulty swallowing for the last day or 2 with c/o globus and  sore throat.  After reaching ER patient was taken to EGD by Dr.  Arlyce Dice which showed esophageal stricture with food impaction and  erosive gastritis duodenal  ulcer. Since,  patient  has c/o  continued difficulty swallowing. Patient has been on medication  for further management. MD ordered a swallow evaluation with SLP.  In the ER patient was found to be mildly febrile but chest x-ray  doesn't show any infiltrates. On initial exam throat did not have  any features of pharyngitis. Type of Study: Modified Barium Swallowing Study Reason for Referral: Objectively evaluate swallowing function Previous Swallow Assessment: bedside swallow evaluation complete  12/28 indicated severe dysphagia with recommendations for  objective evaluation of swallowing Diet Prior to this Study: NPO Temperature Spikes Noted: No Respiratory Status: Room air History of Recent Intubation: No Behavior/Cognition: Alert;Cooperative;Pleasant  mood;Confused;Requires cueing;Distractible;Decreased sustained  attention;Hard of hearing Oral Cavity - Dentition: Adequate natural dentition Oral Motor / Sensory Function: Within functional limits Self-Feeding Abilities: Able to feed self;Needs assist Patient Positioning: Upright in chair Baseline Vocal Quality: Wet Volitional Cough: Strong;Wet Volitional Swallow: Able to elicit (with c/o pain) Anatomy: Other (Comment) (? osteophytes along c-spine, MD not  present to confirm ) Pharyngeal Secretions: Not observed  secondary MBS (however  suspect due to wet vocal quality at baseline)    Reason for Referral Objectively evaluate swallowing function   Oral Phase Oral Preparation/Oral Phase Oral Phase: WFL   Pharyngeal Phase Pharyngeal Phase Pharyngeal Phase: Impaired Pharyngeal - Honey Pharyngeal - Honey Teaspoon: Delayed swallow initiation;Premature  spillage to valleculae;Pharyngeal residue - valleculae;Pharyngeal  residue - pyriform sinuses;Penetration/Aspiration during  swallow;Penetration/Aspiration after swallow;Significant  aspiration (Amount);Pharyngeal residue - cp segment;Pharyngeal  residue - posterior pharnyx;Compensatory strategies attempted  (Comment)  Penetration/Aspiration details (honey teaspoon): Material enters  airway, passes BELOW cords and not ejected out despite cough  attempt by patient Pharyngeal - Nectar Pharyngeal - Nectar Teaspoon: Delayed swallow  initiation;Premature spillage to valleculae;Pharyngeal residue -  valleculae;Pharyngeal residue - pyriform  sinuses;Penetration/Aspiration during  swallow;Penetration/Aspiration after swallow;Significant  aspiration (Amount);Pharyngeal residue - cp segment;Pharyngeal  residue - posterior pharnyx;Compensatory strategies attempted  (Comment) Penetration/Aspiration details (nectar teaspoon): Material enters  airway, passes BELOW cords and not ejected out despite cough  attempt by patient Pharyngeal - Nectar Straw: Delayed swallow initiation;Premature  spillage to valleculae;Pharyngeal residue - valleculae;Pharyngeal  residue - pyriform sinuses;Penetration/Aspiration during  swallow;Penetration/Aspiration after swallow;Significant  aspiration (Amount);Pharyngeal residue - cp segment;Pharyngeal  residue - posterior pharnyx;Compensatory strategies attempted  (Comment) Penetration/Aspiration details (nectar straw): Material enters  airway, passes BELOW cords and not ejected out despite cough  attempt by patient Pharyngeal - Thin Pharyngeal - Thin Teaspoon: Delayed swallow initiation;Premature  spillage to valleculae;Pharyngeal residue - valleculae;Pharyngeal  residue - pyriform sinuses;Penetration/Aspiration during  swallow;Penetration/Aspiration after swallow;Significant  aspiration (Amount);Pharyngeal residue - cp segment;Pharyngeal  residue - posterior pharnyx;Compensatory strategies attempted  (Comment) Penetration/Aspiration details (thin teaspoon): Material enters  airway, passes BELOW cords and not ejected out despite cough  attempt by patient Pharyngeal - Thin Straw: Delayed swallow initiation;Pharyngeal  residue - valleculae;Pharyngeal residue - pyriform  sinuses;Penetration/Aspiration during   swallow;Penetration/Aspiration after swallow;Significant  aspiration (Amount);Pharyngeal residue - cp segment;Pharyngeal  residue - posterior pharnyx;Compensatory strategies attempted  (Comment);Premature spillage to pyriform sinuses Penetration/Aspiration details (thin straw): Material enters  airway, passes BELOW cords and not ejected out despite cough  attempt by patient Pharyngeal - Solids Pharyngeal - Puree: Delayed swallow initiation;Premature spillage  to valleculae;Pharyngeal residue - valleculae;Pharyngeal residue  - pyriform sinuses;Penetration/Aspiration during  swallow;Penetration/Aspiration after swallow;Pharyngeal residue -  cp segment;Pharyngeal residue - posterior pharnyx;Compensatory  strategies attempted (Comment);Moderate aspiration Penetration/Aspiration details (puree): Material enters airway,  passes BELOW cords and not ejected out despite cough attempt by  patient  Cervical Esophageal Phase    GO    Cervical Esophageal Phase Cervical Esophageal Phase: Impaired Cervical Esophageal Phase - Honey Honey Teaspoon: Reduced cricopharyngeal relaxation Cervical Esophageal Phase - Nectar Nectar Teaspoon: Reduced cricopharyngeal relaxation Nectar Straw: Reduced cricopharyngeal relaxation Cervical Esophageal Phase - Thin Thin Teaspoon: Reduced cricopharyngeal relaxation Thin Straw: Reduced cricopharyngeal relaxation Cervical Esophageal Phase - Solids Puree: Reduced cricopharyngeal relaxation        Ashlee Lango MA, CCC-SLP [phone]  [name] Ashlee Meryl 02/02/2013, 9:58 AM      Assessment / Plan:    79 year old female with acute solid food dysphagia which began day before Christmas. EGD with small amount of food and fluid in distal esophagus as well as moderate distal esophageal stricture. Repeat EGD with dilation scheduled as outpatient but still having significant dysphagia. MBSS this am shows severe, suspected primary esophageal dysphagia with decreased UES relaxation. Marked amounts of pharyngeal  residuals resulting in aspiration. Chin tuck did improve UES opening. Unusual that only a moderate distal esophageal stricture alone would cause such  problems. Dr. Leone Payor to review MBSS but one option is to do EGD with dilation of UES. I spoke with sister-in-law who makes medical decisions for patient and she is agreeable to this. Sister-in-law understands that given advanced age patient is at higher risk for complications.    LOS: 2 days   Willette Cluster  02/02/2013, 1:00 PM   [facility] Attending  I have also seen and assessed the patient and agree with the above note. Hard to know exactly what her problems are but EGD with dilation of distal esophageal stricture and possible dilation of UES sensible, I think.  The risks and benefits as well as alternatives of endoscopic procedure(s) have been discussed and reviewed. All questions answered. The patient's POA (sister-in-law) agrees to proceed.  Iva Boop, MD, [facility] [phone] (pager) 02/02/2013 2:31 PM

## 2013-02-02 NOTE — Progress Notes (Signed)
Pt pleasant and willing to have VS and CBG taken at this time.  Lab draw is scheduled for 0800.    Ashlee [name]

## 2013-02-02 NOTE — Telephone Encounter (Signed)
Message copied by Annett Fabian on Mon Feb 02, 2013  3:44 PM ------      Message from: Stan Head E      Created: Mon Feb 02, 2013  3:16 PM      Regarding: cancel EGD for Jan       We were asked to see her again because of persistent dysphagia.            EGD and dilation done - tight UES found by MBS      dilated but suspect cervical osteophytes main issue      18 mm balloon did not impact GE junction       Huge hiatal hernia      flaccid esophagus            Cancel Jan EGD please ------

## 2013-02-02 NOTE — Telephone Encounter (Signed)
Message copied by Annett Fabian on Mon Feb 02, 2013 10:48 AM ------      Message from: Louis Meckel      Created: Sat Jan 31, 2013  4:18 PM       Please schedule EGD with balloon dilation in 2-3 weeks ------

## 2013-02-03 ENCOUNTER — Encounter (HOSPITAL_COMMUNITY): Payer: Self-pay | Admitting: Internal Medicine

## 2013-02-03 ENCOUNTER — Inpatient Hospital Stay (HOSPITAL_COMMUNITY): Payer: Medicare Other

## 2013-02-03 LAB — BASIC METABOLIC PANEL
CO2: 21 mEq/L (ref 19–32)
Calcium: 7.8 mg/dL — ABNORMAL LOW (ref 8.4–10.5)
Chloride: 108 mEq/L (ref 96–112)
GFR calc Af Amer: 85 mL/min — ABNORMAL LOW (ref >90)
GFR calc non Af Amer: 73 mL/min — ABNORMAL LOW (ref >90)
Glucose, Bld: 130 mg/dL — ABNORMAL HIGH (ref 70–99)

## 2013-02-03 LAB — GLUCOSE, CAPILLARY
Glucose-Capillary: 115 mg/dL — ABNORMAL HIGH (ref 70–99)
Glucose-Capillary: 99 mg/dL (ref 70–99)

## 2013-02-03 LAB — CBC
HCT: 27.9 % — ABNORMAL LOW (ref 36.0–46.0)
MCV: 79.9 fL (ref 78.0–100.0)
Platelets: 207 10*3/uL (ref 150–400)
RBC: 3.49 MIL/uL — ABNORMAL LOW (ref 3.87–5.11)
WBC: 8.9 10*3/uL (ref 4.0–10.5)

## 2013-02-03 LAB — CULTURE, GROUP A STREP

## 2013-02-03 MED ORDER — POTASSIUM CHLORIDE 10 MEQ/100ML IV SOLN
10.0000 meq | INTRAVENOUS | Status: AC
  Administered 2013-02-03 (×6): 10 meq via INTRAVENOUS
  Filled 2013-02-03 (×6): qty 100

## 2013-02-03 MED ORDER — LIP MEDEX EX OINT
TOPICAL_OINTMENT | CUTANEOUS | Status: AC
  Administered 2013-02-03: 13:00:00
  Filled 2013-02-03: qty 7

## 2013-02-03 MED ORDER — FUROSEMIDE 10 MG/ML IJ SOLN
40.0000 mg | Freq: Once | INTRAMUSCULAR | Status: AC
  Administered 2013-02-03: 40 mg via INTRAVENOUS
  Filled 2013-02-03: qty 4

## 2013-02-03 NOTE — Evaluation (Signed)
Physical Therapy Evaluation Patient Details Name: Ashlee [name] MRN: [id number] DOB: [birth year] Today's Date: 02/03/2013 Time: 1425-1501 PT Time Calculation (min): 36 min  PT Assessment / Plan / Recommendation History of Present Illness  Ashlee [name] is a 79 y.o. female with history of hypertension hyperlipidemia started experiencing increasing difficulty swallowing for the last day or 2. Patient feels like she is having a lump in the throat with some sore throat I explained. After reaching ER patient was taken to EGD by Dr. Arlyce Dice which showed esophageal stricture with food impaction and erosive gastritis duodenal ulcer. Since patient still has difficulty swallowing Dr. Arlyce Dice wanted swallow evaluation   Clinical Impression  Pt is very deconditioned requiring extensive assistance for mobilizing to [facility]. Pt will benefit from PT to address problems listed. Recommend SNF level at DC unless 24/7 extensive assistance is available at ALF.    PT Assessment  Patient needs continued PT services    Follow Up Recommendations  SNF    Does the patient have the potential to tolerate intense rehabilitation      Barriers to Discharge        Equipment Recommendations  None recommended by PT    Recommendations for Other Services     Frequency Min 3X/week    Precautions / Restrictions Precautions Precautions: Fall Precaution Comments: swalow precautions   Pertinent Vitals/Pain Very congested, wet voice.      Mobility  Bed Mobility Bed Mobility: Rolling Right;Rolling Left;Left Sidelying to Sit;Sitting - Scoot to [place] of Bed;Sit to Sidelying Left;Scooting to [facility] Rolling Right: 3: Mod assist;With rail Rolling Left: 3: Mod assist;With rail Left Sidelying to Sit: 2: Max assist;HOB elevated;With rails Sitting - Scoot to Edge of Bed: 2: Max assist Sit to Sidelying Left: 2: Max assist;HOB flat Scooting to HOB: 1: +1 Total assist Details for Bed Mobility Assistance: pt is able to assist  for rolling, reaching for rails  Transfers Transfers: Sit to Stand;Stand to Sit;Stand Pivot Transfers Sit to Stand: 1: +1 Total assist Stand to Sit: 1: +1 Total assist Stand Pivot Transfers: 1: +1 Total assist Details for Transfer Assistance: pt pushes into extension during standing and pivot, feet sliding forward, blocked to prevent. Ambulation/Gait Ambulation/Gait Assistance: Not tested (comment)    Exercises     PT Diagnosis: Difficulty walking;Generalized weakness  PT Problem List: Decreased strength;Decreased range of motion;Decreased activity tolerance;Decreased balance;Decreased mobility;Decreased knowledge of precautions;Decreased safety awareness;Decreased knowledge of use of DME PT Treatment Interventions: DME instruction;Gait training;Functional mobility training;Therapeutic activities;Therapeutic exercise;Patient/family education     PT Goals(Current goals can be found in the care plan section) Acute Rehab PT Goals Patient Stated Goal: I need the bedpan PT Goal Formulation: With patient/family Time For Goal Achievement: 02/17/13 Potential to Achieve Goals: Fair  Visit Information  Last PT Received On: 02/03/13 Assistance Needed: +2 History of Present Illness: Ashlee [name] is a 79 y.o. female with history of hypertension hyperlipidemia started experiencing increasing difficulty swallowing for the last day or 2. Patient feels like she is having a lump in the throat with some sore throat I explained. After reaching ER patient was taken to EGD by Dr. Arlyce Dice which showed esophageal stricture with food impaction and erosive gastritis duodenal ulcer. Since patient still has difficulty swallowing Dr. Arlyce Dice wanted swallow evaluation        Prior Functioning  Home Living Family/patient expects to be discharged to:: Skilled nursing facility    Cognition  Cognition Arousal/Alertness: Awake/alert Behavior During Therapy: [facility] for tasks assessed/performed Overall Cognitive  Status: Difficult to assess    Extremity/Trunk Assessment Upper Extremity Assessment Upper Extremity Assessment: Generalized weakness Lower Extremity Assessment Lower Extremity Assessment: Generalized weakness;RLE deficits/detail;LLE deficits/detail RLE Deficits / Details: legs tend t push into extension with standing efforts. LLE Deficits / Details: same as R Cervical / Trunk Assessment Cervical / Trunk Assessment: Kyphotic   Balance Balance Balance Assessed: Yes Static Sitting Balance Static Sitting - Balance Support: Bilateral upper extremity supported;Feet supported Static Sitting - Level of Assistance: 1: +1 Total assist;4: Min assist Static Sitting - Comment/# of Minutes: initially retropulsive, gradually gained forward flexion and sat on BSC with UE support and supervision.  End of Session PT - End of Session Equipment Utilized During Treatment: Gait belt Activity Tolerance: Patient tolerated treatment well;Patient limited by fatigue Patient left: in bed;with call bell/phone within reach;with bed alarm set;with nursing/sitter in room;with family/visitor present Nurse Communication: Mobility status  GP     Rada Hay 02/03/2013, 3:10 PM [facility] PT [phone]

## 2013-02-03 NOTE — Progress Notes (Addendum)
Speech Language Pathology Treatment: Dysphagia  Patient Details Name: Ashlee [name] MRN: [id number] DOB: 1914-08-20 Today's Date: 02/03/2013 Time: 1478-2956 SLP Time Calculation (min): 37 min  Assessment / Plan / Recommendation Clinical Impression  Pt seen with sister-in-law and HCPOA, [name], present.  [name] reports pt consumed a few bites of grits and milk for breakfast - lengthy process to feed pt and overt cough reported with liquids.  Pt remains very hoarse with decreased respiratory support - able to state 2-3 breathy words per breath group.  Much effort required to pt to cough and expectoration viscous whitish secretions.    Observed pt received a SINGLE Tsp of nectar thick milk with total cues required for pt to conduct chin tuck.  Multiple swallows observed (x4) with delayed productive excessive coughing indicating likely aspiration.  SLP orally suctioned pt to clear whitish secretions.    Educated [name] and pt to suspected ongoing aspiration even with incorporation of mitigation strategies and s/p esophageal dilatation/procedure.    Reviewed clinical purpose of precautions and dietary changes and increased pulmonary risk with very thick purees - advised to make puree creamy to ease swallow.    Pt's dysphagia prohibits her from receiving adequate nutrition and results in overt aspiration.    Recommend MD address issue of ongoing dysphagia/aspiration to establish plan, ? Continue po with mitigation strategies in hopes pt to improve?     HPI HPI: Ashlee [name] is a 79 y.o. female with history of hypertension hyperlipidemia started experiencing increasing difficulty swallowing for the last day or 2 with c/o globus and sore throat.  After reaching ER patient was taken to EGD by Dr. Arlyce Dice which showed esophageal stricture with food impaction and erosive gastritis duodenal ulcer. Since, patient has c/o continued difficulty swallowing. Patient has been on medication for further management.  MD ordered a swallow evaluation with SLP. In the ER patient was found to be mildly febrile but chest x-ray doesn't show any infiltrates. On initial exam throat did not have any features of pharyngitis per MD note review.  Pt underwent MBS with findings of suspected severe esophageal dysphagia and is now s/p endoscopy with dilatation.  GI MD suspected pt's primary pharyngoesophageal deficits were due to cervical osteophyte.  Per [name], sister in Database administrator, pt was eating well prior to 9 days ago when she developed a sore throat     Pertinent Vitals Afebrile, decreased  SLP Plan  Continue with current plan of care (? if pt would benefit from GOC meeting due to ongoing dysphagia and concern for nutrition risk)    Recommendations Liquids provided via: Teaspoon Medication Administration: Crushed with puree Supervision: Full supervision/cueing for compensatory strategies Compensations: Slow rate;Small sips/bites;Multiple dry swallows after each bite/sip;Follow solids with liquid Postural Changes and/or Swallow Maneuvers: Chin tuck              Oral Care Recommendations: Oral care Q4 per protocol Follow up Recommendations: Skilled Nursing facility Plan: Continue with current plan of care (? if pt would benefit from GOC meeting due to ongoing dysphagia and concern for nutrition risk)    GO     Donavan Burnet, MS [facility] SLP [phone]

## 2013-02-03 NOTE — Progress Notes (Signed)
    Progress Note   Subjective  sister-in-law at bedside. Patient's mental status has improved significantly. She hasn't tried to eat since EGD with dilation yesterday.    Objective   Vital signs in last 24 hours: Temp:  [98.3 F (36.8 C)-99.4 F (37.4 C)] 98.8 F (37.1 C) (12/30 0612) Pulse Rate:  [91-112] 91 (12/30 0612) Resp:  [15-24] 18 (12/30 0612) BP: (124-174)/(64-84) 128/74 mmHg (12/30 0612) SpO2:  [93 %-100 %] 100 % (12/30 0612) Last BM Date: 02/02/13 General:    white female in NAD Heart:  Regular rate, irregular rhythm Lungs: Respirations even and unlabored, anterior chest congested Abdomen:  Soft, nontender, mildly distended with active bowel sounds. Psych:  Cooperative. .    Lab Results:  Recent Labs  02/01/13 0800 02/02/13 1159 02/03/13 0545  WBC 10.3 12.5* 8.9  HGB 10.3* 11.3* 9.3*  HCT 31.5* 34.0* 27.9*  PLT 220 234 207   BMET  Recent Labs  02/01/13 0800 02/02/13 1159 02/03/13 0545  NA 135 137 139  K 2.7* 3.1* 3.2*  CL 99 101 108  CO2 26 18* 21  GLUCOSE 109* 133* 130*  BUN 12 14 16   CREATININE 0.57 0.62 0.63  CALCIUM 8.5 8.7 7.8*    Studies: EGD 02/02/13 The UES was stenotic. The esophagus appeared  relatively flaccid. There was a suggestion of a ring at GE  junction, no esophagitis. A 10+ cm hiatus hernia was seen.  Othersise normal exam to antrum. Retroflexion done. Dilation was  then performed at the GE junction but no impact so the UES and  proximal esophagus was dilated to 15 mm no heme.  ENDOSCOPIC IMPRESSION:   1. Proximal esophageal stenosis - likely from severe cervical DJD  (known by CT earlier 2014)  2. Hiatus hernia - very large 10+ cm  3. Poor esophgeal motility suspected   RECOMMENDATIONS:   Start dysphagia 1 diet with nectar thick liquids and maneuvers to  reduce risk of aspiration. Try to get back to usual surroundings  soon as she is sundowning.  We will follow-up tomorrow.  Will cancel the Jan EGD w/ Dr.  Arlyce Dice.  Stay on PPI - change to oral when possible   Assessment / Plan:    Dysphagia, multifactorial (dysmotility, large hiatal hernia, stenotic UES and possible distal esophageal ring). Hopefully she can at least tolerate a dysphagia 1 diet after dilation of proximal and distal esophagus yesterday. Continue chin tuck maneuvers as recommended by SLP   LOS: 3 days   Willette Cluster  02/03/2013, 9:18 AM   [facility] Attending  I have also seen and assessed the patient earlier and agree with the above note. The patient is tolerating mashed potatoes and other soft foods better this evening.  Discussed with family and would not ever want gastrostomy. They hope to take patient back to assisted living and hire someone to feed her. I think that is sensible and would dc her tomorrow. She is 89 and failing and getting her to familiar surroundings and feeding for comfort, etc seems best. Would not chase pneumonia, etc. pCXR negative. She will probably get an aspiration pneumonia at some point but it could be sensible to not treat it.  Signing off  Iva Boop, MD, [facility] [phone] (pager) 02/03/2013 9:05 PM

## 2013-02-03 NOTE — Progress Notes (Signed)
Patient ID: Ashlee [name], female   DOB: [birth year], 79 y.o.   MRN: [id number]  [facility] PROGRESS NOTE  Ashlee [name]:[id number] DOB: [birth year] DOA: 01/31/2013 PCP: Ezequiel Kayser, MD  Brief narrative:  79 y.o. female with history of hypertension, hyperlipidemia, started experiencing increasing difficulty swallowing for the last day or 2. Patient feels like she is having a lump in the throat with some sore throat. After reaching ER patient was taken to EGD by Dr. Arlyce Dice which showed esophageal stricture with food impaction and erosive gastritis duodenal ulcer. Since patient still has difficulty swallowing Dr. Arlyce Dice wanted swallow evaluation and patient has been admitted to med floor for further management.   Principal Problem:  Dysphagia  - severe and most likely secondary to stricture and ? Erosive gatritis  - repeat EGD 12/29 with proximal esophageal stenosis - encouraged PO intake, dysphagia I diet as recommended  - appreciate GI input  Active Problems:  Food impaction of esophagus  - esophageal stenosis again confirmed on EGD  - dys I diet encouraged and recommended ? Vascular congestion - noted on exam this AM - stop IVF and give one dose of Lasix IV 40 mg - CXR ordered  Duodenal ulcer  - continue PPP IV  Hypokalemia  - supplement again today via IV route, will give Kcl 10 mL x 6 runs - repeat BMP in AM  Anemia of chronic disease  - slight drop since admission, possibly dilutional  - no signs of active bleed - CBC in AM HTN  - reasonable inpatient control  Failure to thrive - secondary to the above problems, poor oral intake, severe dysphagia  - PT evaluation once pt more medically stable   Consultants: GI Procedures/Studies:  Dg Chest 2 View 01/31/2013 table appearance of the chest. No evidence of acute airspace disease.  ENDOSCOPIC IMPRESSION: 02/03/2103 - Dr. Leone Payor Proximal esophageal stenosis - likely from severe cervical DJD, Hiatus hernia -  very large 10+ cm, Poor esophgeal motility suspected --> RECOMMENDATIONS: Start dysphagia 1 diet with nectar thick liquids and maneuvers to reduce risk of aspiration. Try to get back to usual surroundings soon as she is sundowning.   Antibiotics:  Flagyl 12/27 --> 12/29  Code Status: DNR Family Communication: Pt and nephew at bedside  Disposition Plan: Remains inpatient    HPI/Subjective: No events overnight.   Objective: Filed Vitals:   02/02/13 1605 02/02/13 1620 02/02/13 2215 02/03/13 0612  BP: 137/67 143/80 147/64 128/74  Pulse: 111 112 91 91  Temp: 99.4 F (37.4 C) 98.9 F (37.2 C) 98.3 F (36.8 C) 98.8 F (37.1 C)  TempSrc: Axillary Axillary Oral Oral  Resp: 22 20 20 18   Height:      Weight:      SpO2: 94% 95% 98% 100%    Intake/Output Summary (Last 24 hours) at 02/03/13 1120 Last data filed at 02/03/13 0700  Gross per 24 hour  Intake  40.75 ml  Output    300 ml  Net -259.25 ml    Exam:   General:  Pt is alert, follows some commands appropriately, not in acute distress  Cardiovascular: Regular rate and rhythm, S1/S2, no murmurs, no rubs, no gallops  Respiratory: Crackles bilaterally with rhonchi   Abdomen: Soft, non tender, non distended, bowel sounds present, no guarding  Extremities: No edema, pulses DP and PT palpable bilaterally  Data Reviewed: Basic Metabolic Panel:  Recent Labs Lab 01/31/13 0850 02/01/13 0800 02/02/13 1159 02/03/13 0545  NA 135 135 137 139  K 3.3* 2.7* 3.1* 3.2*  CL 98 99 101 108  CO2 26 26 18* 21  GLUCOSE 114* 109* 133* 130*  BUN 19 12 14 16   CREATININE 0.63 0.57 0.62 0.63  CALCIUM 9.2 8.5 8.7 7.8*   Liver Function Tests:  Recent Labs Lab 02/01/13 0800  AST 19  ALT 11  ALKPHOS 59  BILITOT 0.6  PROT 6.5  ALBUMIN 3.3*   CBC:  Recent Labs Lab 01/31/13 0850 02/01/13 0800 02/02/13 1159 02/03/13 0545  WBC 10.1 10.3 12.5* 8.9  HGB 11.5* 10.3* 11.3* 9.3*  HCT 35.0* 31.5* 34.0* 27.9*  MCV 81.0 80.8 79.8  79.9  PLT 241 220 234 207   CBG:  Recent Labs Lab 02/01/13 2330 02/02/13 0722 02/02/13 1212 02/02/13 2317 02/03/13 0546  GLUCAP 104* 92 124* 135* 115*    Recent Results (from the past 240 hour(s))  CULTURE, BLOOD (ROUTINE X 2)     Status: None   Collection Time    01/31/13  9:48 AM      Result Value Range Status   Specimen Description BLOOD RIGHT ARM  5 ML IN [facility] BOTTLE   Final   Special Requests NONE   Final   Culture  Setup Time     Final   Value: 01/31/2013 15:31     Performed at [facility]   Culture     Final   Value:        BLOOD CULTURE RECEIVED NO GROWTH TO DATE CULTURE WILL BE HELD FOR 5 DAYS BEFORE ISSUING A FINAL NEGATIVE REPORT     Performed at [facility]   Report Status PENDING   Incomplete  CULTURE, BLOOD (ROUTINE X 2)     Status: None   Collection Time    01/31/13  9:52 AM      Result Value Range Status   Specimen Description BLOOD RIGHT FOREARM  5 ML IN [facility] BOTTLE   Final   Special Requests NONE   Final   Culture  Setup Time     Final   Value: 01/31/2013 15:30     Performed at [facility]   Culture     Final   Value:        BLOOD CULTURE RECEIVED NO GROWTH TO DATE CULTURE WILL BE HELD FOR 5 DAYS BEFORE ISSUING A FINAL NEGATIVE REPORT     Performed at [facility]   Report Status PENDING   Incomplete  RAPID STREP SCREEN     Status: None   Collection Time    01/31/13  8:57 PM      Result Value Range Status   Streptococcus, Group A Screen (Direct) NEGATIVE  NEGATIVE Final   Comment: (NOTE)     A Rapid Antigen test may result negative if the antigen level in the     sample is below the detection level of this test. The FDA has not     cleared this test as a stand-alone test therefore the rapid antigen     negative result has reflexed to a Group A Strep culture.  CULTURE, GROUP A STREP     Status: None   Collection Time    01/31/13  8:57 PM      Result Value Range Status   Specimen Description THROAT   Final    Special Requests NONE   Final   Culture     Final   Value: No Beta Hemolytic Streptococci Isolated     Performed at  [facility]   Report Status 02/03/2013 FINAL   Final  SURGICAL PCR SCREEN     Status: None   Collection Time    02/01/13 12:14 AM      Result Value Range Status   MRSA, PCR NEGATIVE  NEGATIVE Final   Staphylococcus aureus NEGATIVE  NEGATIVE Final   Comment:            The Xpert SA Assay (FDA     approved for NASAL specimens     in patients over 21 years of age),     is one component of     a comprehensive surveillance     program.  Test performance has     been validated by [employer] for patients greater     than or equal to 62 year old.     It is not intended     to diagnose infection nor to     guide or monitor treatment.     Scheduled Meds: . antiseptic oral rinse  15 mL Mouth Rinse q12n4p  . chlorhexidine  15 mL Mouth Rinse BID  . enoxaparin (LOVENOX) injection  40 mg Subcutaneous Q24H  . feeding supplement (RESOURCE BREEZE)  1 Container Oral BID BM  . lip balm      . pantoprazole (PROTONIX) IV  40 mg Intravenous Q24H   Continuous Infusions: . dextrose 5 % and 0.9 % NaCl with KCl 40 mEq/L 75 mL/hr at 02/03/13 0847   Debbora Presto, MD  [facility] Pager [phone]  If 7PM-7AM, please contact night-coverage [email or web address] Password [email or web address] 02/03/2013, 11:20 AM   LOS: 3 days

## 2013-02-04 ENCOUNTER — Inpatient Hospital Stay (HOSPITAL_COMMUNITY): Payer: Medicare Other

## 2013-02-04 DIAGNOSIS — IMO0002 Reserved for concepts with insufficient information to code with codable children: Secondary | ICD-10-CM

## 2013-02-04 DIAGNOSIS — D509 Iron deficiency anemia, unspecified: Secondary | ICD-10-CM

## 2013-02-04 DIAGNOSIS — E876 Hypokalemia: Secondary | ICD-10-CM

## 2013-02-04 LAB — BASIC METABOLIC PANEL
CO2: 27 mEq/L (ref 19–32)
Chloride: 100 mEq/L (ref 96–112)
Creatinine, Ser: 0.84 mg/dL (ref 0.50–1.10)
GFR calc Af Amer: 66 mL/min — ABNORMAL LOW (ref >90)
Glucose, Bld: 124 mg/dL — ABNORMAL HIGH (ref 70–99)
Potassium: 2.9 mEq/L — CL (ref 3.7–5.3)
Sodium: 142 mEq/L (ref 137–147)

## 2013-02-04 LAB — CBC
HCT: 35.4 % — ABNORMAL LOW (ref 36.0–46.0)
Hemoglobin: 11.7 g/dL — ABNORMAL LOW (ref 12.0–15.0)
MCH: 26.7 pg (ref 26.0–34.0)
MCV: 80.8 fL (ref 78.0–100.0)
RBC: 4.38 MIL/uL (ref 3.87–5.11)
RDW: 17.7 % — ABNORMAL HIGH (ref 11.5–15.5)
WBC: 12.8 10*3/uL — ABNORMAL HIGH (ref 4.0–10.5)

## 2013-02-04 LAB — POTASSIUM: Potassium: 3.6 mEq/L — ABNORMAL LOW (ref 3.7–5.3)

## 2013-02-04 LAB — MAGNESIUM: Magnesium: 1.6 mg/dL (ref 1.5–2.5)

## 2013-02-04 LAB — GLUCOSE, CAPILLARY: Glucose-Capillary: 121 mg/dL — ABNORMAL HIGH (ref 70–99)

## 2013-02-04 MED ORDER — MORPHINE SULFATE 2 MG/ML IJ SOLN
2.0000 mg | Freq: Once | INTRAMUSCULAR | Status: AC
  Administered 2013-02-04: 2 mg via INTRAVENOUS

## 2013-02-04 MED ORDER — FUROSEMIDE 10 MG/ML IJ SOLN
40.0000 mg | Freq: Once | INTRAMUSCULAR | Status: AC
  Administered 2013-02-04: 40 mg via INTRAVENOUS
  Filled 2013-02-04: qty 4

## 2013-02-04 MED ORDER — MORPHINE SULFATE 2 MG/ML IJ SOLN
2.0000 mg | INTRAMUSCULAR | Status: AC
  Administered 2013-02-04: 2 mg via INTRAVENOUS
  Filled 2013-02-04: qty 1

## 2013-02-04 MED ORDER — FUROSEMIDE 10 MG/ML IJ SOLN: 60.0000 mg | Freq: Once | INTRAMUSCULAR | Status: DC

## 2013-02-04 MED ORDER — FUROSEMIDE 10 MG/ML IJ SOLN
60.0000 mg | Freq: Once | INTRAMUSCULAR | Status: AC
  Administered 2013-02-04: 60 mg via INTRAVENOUS
  Filled 2013-02-04: qty 6

## 2013-02-04 MED ORDER — MORPHINE SULFATE 2 MG/ML IJ SOLN
INTRAMUSCULAR | Status: AC
  Filled 2013-02-04: qty 1

## 2013-02-04 MED ORDER — POTASSIUM CHLORIDE 10 MEQ/100ML IV SOLN
10.0000 meq | INTRAVENOUS | Status: AC
  Administered 2013-02-04 (×4): 10 meq via INTRAVENOUS
  Filled 2013-02-04 (×4): qty 100

## 2013-02-04 NOTE — Progress Notes (Signed)
Speech Language Pathology Treatment: Dysphagia  Patient Details Name: Ashlee [name] MRN: [id number] DOB: 1914/11/05 Today's Date: 02/04/2013 Time: 4696-2952 SLP Time Calculation (min): 15 min  Assessment / Plan / Recommendation Clinical Impression  Pt continues with clinical indications of severe dysphagia, her sister in law reports improved swallow function today.  Observed pt being fed mashed potatoes and nectar juice via tsp.  Prolonged oral phase with delayed swallow initiation apparent. Multiple swallows noted with each bolus but no overt indication of aspiration with minimal po observed.  Ashlee [name]) reports pt with coughing episode at lunch -likely aspiration.     Pt understandably having difficulty conducting adequate chin tuck, stating she does chin tuck if she "can remember"- required total cues to conduct with this SLP.    In approximately 15 minutes of time, pt consumed 4 bites of mashed potatoes and 2 tsps of nectar liquids.  Pt declined further intake stating she was "tired".  This feeding process is laborious for pt and feeder. Note events of last pm and CXR findings.   Pt's dysphagia again prohibits her from receiving adequate nutrition and she is likely continuing to aspirate.  SLP again informed Ashlee [name] (who is clearly patient and methodical with pt's feeding) of suspected ongoing aspiration - even with diet modification/compensation strategies.    Given pt's ongoing severe dysphagia, SLP recommends MD consider a palliative referral to allow pt to establish her goals of care.        HPI HPI: Ashlee [name] is a 79 y.o. female with history of hypertension hyperlipidemia started experiencing increasing difficulty swallowing for the last day or 2 with c/o globus and sore throat.  After reaching ER patient was taken to EGD by Dr. Arlyce Dice which showed esophageal stricture with food impaction and erosive gastritis duodenal ulcer. Since, patient has c/o continued difficulty  swallowing. Patient has been on medication for further management. MD ordered a swallow evaluation with SLP. In the ER patient was found to be mildly febrile but chest x-ray doesn't show any infiltrates. On initial exam throat did not have any features of pharyngitis per MD note review.  Pt underwent MBS with findings of suspected severe esophageal dysphagia and is now s/p endoscopy with dilatation.  GI MD suspected pt's primary pharyngoesophageal deficits were due to cervical osteophyte.  Per Ashlee [name], sister in law and HCPOA, pt was eating well prior to 9 days ago when she developed a sore throat     Pertinent Vitals   SLP Plan  Continue with current plan of care (? if would benefit from GOC meeting due to ongoing dysphagia)    Recommendations Diet recommendations: Dysphagia 1 (puree);Nectar-thick liquid (with accepted risks, esssentially comfort intake)- with accepted ongoing aspiration  Liquids provided via: Teaspoon Medication Administration: Crushed with puree Supervision: Full supervision/cueing for compensatory strategies;Trained caregiver to feed patient Compensations: Slow rate;Small sips/bites;Follow solids with liquid;Multiple dry swallows after each bite/sip Postural Changes and/or Swallow Maneuvers: Chin tuck              Oral Care Recommendations: Oral care BID Follow up Recommendations: Skilled Nursing facility Plan: Continue with current plan of care (? if would benefit from GOC meeting due to ongoing dysphagia)    GO     Ashlee Burnet, MS [name] SLP [phone]

## 2013-02-04 NOTE — Progress Notes (Signed)
[facility] PROGRESS NOTE  Ashlee [name] ZOX:[id number] DOB: January 31, 1915 DOA: 01/31/2013 PCP: Ashlee Kayser, MD  Assessment/Plan:  Dysphasia -Per GI Dysphagia, multifactorial (dysmotility, large hiatal hernia, stenotic UES and possible distal esophageal ring). Hopefully she can at least tolerate a dysphagia 1 diet after dilation of proximal and distal esophagus 02/02/2013  -Spent a distended. Time speaking with Ashlee [name] [facility]) as well as patient concerning their wishes for patient's final days. Patient wishes to have a meeting with palliative care on 02/05/2013 at approximately midday (feels patient will be most awake at this point). Will place consult  Hypokalemia -Replete K., check magnesium, -Check daily every 4 hours  HTN -Slightly above AHA guidelines but given patient's age would not attempt to add medication to lower BP  Hiatal hernia -See dysphasia  Stenotic upper esophageal sphincter -See dysphasia  Anemia -Stable would not work up or treatment at this point -   Code Status: DO NOT RESUSCITATE Family Communication:  Family at bedside for discussion of plan of care Disposition Plan: Per palliative care plan   Consultants: Palliative care  Procedures: 02/02/2013 dilation of proximal and distal esophagus   PCXR 02/04/2013 Normal cardiac silhouette. Chronic bronchitic markings centrally. No  effusion, infiltrate, or pneumothorax.      Antibiotics:    HPI/Subjective: [name]  PMHx hypertension, hyperlipidemia, hypokalemia, iron deficiency anemia, stricture and stenosis of his esophagus, while the ulcer, dysphagia, food impaction of the esophagus, started experiencing increasing difficulty swallowing for the last day or 2. Patient feels like she is having a lump in the throat with some sore throat. After reaching ER patient was taken to EGD by Dr. Arlyce [name] which showed esophageal stricture with food impaction and erosive gastritis duodenal  ulcer. Since patient still has difficulty swallowing Dr. Arlyce [name] wanted swallow evaluation and patient has been admitted to med floor for further management. 02/04/2013 patient's maintenance chart is that she would like to drink thin liquids such as water and juice. Negative SOB, states negative CP     Objective: Filed Vitals:   02/03/13 2200 02/04/13 0132 02/04/13 0600 02/04/13 1400  BP: 137/83 122/51 135/55 136/45  Pulse: 124 73 101 87  Temp: 98.6 F (37 C)   98.8 F (37.1 C)  TempSrc: Oral   Oral  Resp: 19  18 18   Height:      Weight:      SpO2: 94% 98% 97% 99%    Intake/Output Summary (Last 24 hours) at 02/04/13 1445 Last data filed at 02/04/13 1400  Gross per 24 hour  Intake    790 ml  Output   1100 ml  Net   -310 ml   Filed Weights   01/31/13 1526  Weight: 53.071 kg (117 lb)    Exam:   General:  Lethargic, arousable; once aroused A./O. x4, NAD  Cardiovascular: Irregular irregular rhythm (sinus arrhythmia vs true A. fib), negative murmurs rubs or gallops, DP/PT pulse one plus bilateral  Respiratory: Diffuse coarse breath sounds  Abdomen: Soft, nontender, nondistended plus bowel sound  Musculoskeletal: Negative pedal edema   Data Reviewed: Basic Metabolic Panel:  Recent Labs Lab 01/31/13 0850 02/01/13 0800 02/02/13 1159 02/03/13 0545 02/04/13 1308  NA 135 135 137 139 142  K 3.3* 2.7* 3.1* 3.2* 2.9*  CL 98 99 101 108 100  CO2 26 26 18* 21 27  GLUCOSE 114* 109* 133* 130* 124*  BUN 19 12 14 16 19   CREATININE 0.63 0.57 0.62 0.63 0.84  CALCIUM 9.2 8.5 8.7 7.8*  8.8   Liver Function Tests:  Recent Labs Lab 02/01/13 0800  AST 19  ALT 11  ALKPHOS 59  BILITOT 0.6  PROT 6.5  ALBUMIN 3.3*   No results found for this basename: LIPASE, AMYLASE,  in the last 168 hours No results found for this basename: AMMONIA,  in the last 168 hours CBC:  Recent Labs Lab 01/31/13 0850 02/01/13 0800 02/02/13 1159 02/03/13 0545 02/04/13 1308  WBC 10.1 10.3  12.5* 8.9 12.8*  HGB 11.5* 10.3* 11.3* 9.3* 11.7*  HCT 35.0* 31.5* 34.0* 27.9* 35.4*  MCV 81.0 80.8 79.8 79.9 80.8  PLT 241 220 234 207 268   Cardiac Enzymes: No results found for this basename: CKTOTAL, CKMB, CKMBINDEX, TROPONINI,  in the last 168 hours BNP (last 3 results) No results found for this basename: PROBNP,  in the last 8760 hours CBG:  Recent Labs Lab 02/03/13 0546 02/03/13 1243 02/03/13 1824 02/03/13 2355 02/04/13 1218  GLUCAP 115* 117* 141* 99 115*    Recent Results (from the past 240 hour(s))  CULTURE, BLOOD (ROUTINE X 2)     Status: None   Collection Time    01/31/13  9:48 AM      Result Value Range Status   Specimen Description BLOOD RIGHT ARM  5 ML IN [facility] BOTTLE   Final   Special Requests NONE   Final   Culture  Setup Time     Final   Value: 01/31/2013 15:31     Performed at [facility]   Culture     Final   Value:        BLOOD CULTURE RECEIVED NO GROWTH TO DATE CULTURE WILL BE HELD FOR 5 DAYS BEFORE ISSUING A FINAL NEGATIVE REPORT     Performed at [facility]   Report Status PENDING   Incomplete  CULTURE, BLOOD (ROUTINE X 2)     Status: None   Collection Time    01/31/13  9:52 AM      Result Value Range Status   Specimen Description BLOOD RIGHT FOREARM  5 ML IN [facility] BOTTLE   Final   Special Requests NONE   Final   Culture  Setup Time     Final   Value: 01/31/2013 15:30     Performed at [facility]   Culture     Final   Value:        BLOOD CULTURE RECEIVED NO GROWTH TO DATE CULTURE WILL BE HELD FOR 5 DAYS BEFORE ISSUING A FINAL NEGATIVE REPORT     Performed at [facility]   Report Status PENDING   Incomplete  RAPID STREP SCREEN     Status: None   Collection Time    01/31/13  8:57 PM      Result Value Range Status   Streptococcus, Group A Screen (Direct) NEGATIVE  NEGATIVE Final   Comment: (NOTE)     A Rapid Antigen test may result negative if the antigen level in the     sample is below the detection level  of this test. The FDA has not     cleared this test as a stand-alone test therefore the rapid antigen     negative result has reflexed to a Group A Strep culture.  CULTURE, GROUP A STREP     Status: None   Collection Time    01/31/13  8:57 PM      Result Value Range Status   Specimen Description THROAT   Final  Special Requests NONE   Final   Culture     Final   Value: No Beta Hemolytic Streptococci Isolated     Performed at [facility]   Report Status 02/03/2013 FINAL   Final  SURGICAL PCR SCREEN     Status: None   Collection Time    02/01/13 12:14 AM      Result Value Range Status   MRSA, PCR NEGATIVE  NEGATIVE Final   Staphylococcus aureus NEGATIVE  NEGATIVE Final   Comment:            The Xpert SA Assay (FDA     approved for NASAL specimens     in patients over 47 years of age),     is one component of     a comprehensive surveillance     program.  Test performance has     been validated by [employer] for patients greater     than or equal to 26 year old.     It is not intended     to diagnose infection nor to     guide or monitor treatment.     Studies: Dg Chest Port 1 View  02/04/2013   CLINICAL DATA:  Edema, rapid respirations  EXAM: PORTABLE CHEST - 1 VIEW  COMPARISON:  Radiograph 02/03/2013  FINDINGS: Normal cardiac silhouette. Chronic bronchitic markings centrally. No effusion, infiltrate, or pneumothorax.  IMPRESSION: No acute findings. Chronic bronchitic markings centrally. No change from prior.   Electronically Signed   By: Genevive Bi M.D.   On: 02/04/2013 02:01   Dg Chest Port 1 View  02/03/2013   CLINICAL DATA:  CHF  EXAM: PORTABLE CHEST - 1 VIEW  COMPARISON:  01/31/2013, 10/05/2012  FINDINGS: There is bilateral interstitial thickening most consistent with chronic bronchitic changes. There is no new focal parenchymal opacity, pleural effusion or pneumothorax. Stable cardiomediastinal silhouette. Dense mitral annular calcification noted.  Unremarkable osseous structures.  IMPRESSION: No acute cardiopulmonary process.  Chronic bronchitic changes.   Electronically Signed   By: Elige Ko   On: 02/03/2013 13:11    Scheduled Meds: . antiseptic oral rinse  15 mL Mouth Rinse q12n4p  . chlorhexidine  15 mL Mouth Rinse BID  . enoxaparin (LOVENOX) injection  40 mg Subcutaneous Q24H  . feeding supplement (RESOURCE BREEZE)  1 Container Oral BID BM  . pantoprazole (PROTONIX) IV  40 mg Intravenous Q24H   Continuous Infusions:   Principal Problem:   Dysphagia Active Problems:   Food impaction of esophagus   Stricture and stenosis of esophagus   Duodenal ulcer   Acute esophagitis   Hiatal hernia    Time spent: 60 minutes   WOODS, CURTIS, J  [facility] Pager [phone]. If 7PM-7AM, please contact night-coverage at [email or web address], password [facility] [place] 02/04/2013, 2:45 PM  LOS: 4 days

## 2013-02-04 NOTE — Progress Notes (Signed)
Pt refused lab work/ cbg this am. Will try again at later time.

## 2013-02-04 NOTE — Progress Notes (Addendum)
Shift event: RN paged this NP secondary to pt having slightly increased WOB with tachypnea and copious secretions with wet lung sounds. BP 130s. O2 sat 90-91% on 2L per [place]. Lasix 60mg  IV x 1 ordered and NP to bedside. S: Pt says she doesn't feel well, tired, sorethroat and some SOB. No chest pain. O: Frail elderly WF in mild respiratory distress. Not toxic appearing. Alert and oriented, appropriate. VS reviewed. Card: S1S2 irreg rhythm. Lungs: wet. RR 24 with slightly increased WOB. O2 sat 89-98% on 2L.  A/P: 1. Acute respiratory distress likely secondary to pulmonary edema in setting of ? aspiration. Pt is doing much better clinically after Lasix and MSO4 given. RR is slowed and no increased WOB. Pt is incontinent, will not place Foley at this time. Has soaked a diaper earlier in shift. Will monitor output post Lasix but clinical response is satisfactory. CXR ordered.  2. irreg rhythm-12 lead EKG showed sinus tach with PVCs. Continue to follow.  3. Esophageal stricture s/p EGD with dilatation- per GI, CXR to compare in case of aspiration. SLP following and pt is on dysphagia diet with precautions.  Jimmye Norman, NP [facility] Update at [phone]. RN paged NP again secondary to pt having similar sx as before. NP to bedside. Pt is non toxic appearing and without increased WOB this time. Lungs wet. Suctioned orally. Lasix 40mg  IV and MSO4 2mg  IV given x 1. Has not had UO since first Lasix. CXR was neg. Pt hasn't had any food or liquids po tonight. SLP following. No asp PNA on CXR. No drop in O2 sats, remain over 93% on 2L [place]. Will ask RT for recs with suctioning, ? Chest PT.  KJKG, NP

## 2013-02-05 DIAGNOSIS — I4891 Unspecified atrial fibrillation: Secondary | ICD-10-CM

## 2013-02-05 DIAGNOSIS — I10 Essential (primary) hypertension: Secondary | ICD-10-CM

## 2013-02-05 DIAGNOSIS — Z515 Encounter for palliative care: Secondary | ICD-10-CM

## 2013-02-05 LAB — COMPREHENSIVE METABOLIC PANEL
ALBUMIN: 3.3 g/dL — AB (ref 3.5–5.2)
ALT: 16 U/L (ref 0–35)
AST: 19 U/L (ref 0–37)
Alkaline Phosphatase: 54 U/L (ref 39–117)
BUN: 30 mg/dL — ABNORMAL HIGH (ref 6–23)
CO2: 25 mEq/L (ref 19–32)
Calcium: 9.1 mg/dL (ref 8.4–10.5)
Chloride: 102 mEq/L (ref 96–112)
Creatinine, Ser: 0.77 mg/dL (ref 0.50–1.10)
GFR calc Af Amer: 80 mL/min — ABNORMAL LOW (ref >90)
GFR calc non Af Amer: 69 mL/min — ABNORMAL LOW (ref >90)
Glucose, Bld: 113 mg/dL — ABNORMAL HIGH (ref 70–99)
Potassium: 3.7 mEq/L (ref 3.7–5.3)
SODIUM: 141 meq/L (ref 137–147)
TOTAL PROTEIN: 7 g/dL (ref 6.0–8.3)
Total Bilirubin: 0.3 mg/dL (ref 0.3–1.2)

## 2013-02-05 LAB — CBC WITH DIFFERENTIAL/PLATELET
BASOS ABS: 0 10*3/uL (ref 0.0–0.1)
Basophils Relative: 0 % (ref 0–1)
EOS ABS: 0.1 10*3/uL (ref 0.0–0.7)
Eosinophils Relative: 1 % (ref 0–5)
HCT: 32.9 % — ABNORMAL LOW (ref 36.0–46.0)
Hemoglobin: 10.6 g/dL — ABNORMAL LOW (ref 12.0–15.0)
LYMPHS PCT: 23 % (ref 12–46)
Lymphs Abs: 1.6 10*3/uL (ref 0.7–4.0)
MCH: 26.4 pg (ref 26.0–34.0)
MCHC: 32.2 g/dL (ref 30.0–36.0)
MCV: 82 fL (ref 78.0–100.0)
Monocytes Absolute: 0.9 10*3/uL (ref 0.1–1.0)
Monocytes Relative: 13 % — ABNORMAL HIGH (ref 3–12)
Neutro Abs: 4.5 10*3/uL (ref 1.7–7.7)
Neutrophils Relative %: 63 % (ref 43–77)
PLATELETS: 273 10*3/uL (ref 150–400)
RBC: 4.01 MIL/uL (ref 3.87–5.11)
RDW: 18 % — AB (ref 11.5–15.5)
WBC: 7.1 10*3/uL (ref 4.0–10.5)

## 2013-02-05 LAB — POTASSIUM
Potassium: 3.4 mEq/L — ABNORMAL LOW (ref 3.7–5.3)
Potassium: 3.8 mEq/L (ref 3.7–5.3)
Potassium: 3.7 mEq/L (ref 3.7–5.3)

## 2013-02-05 LAB — GLUCOSE, CAPILLARY
Glucose-Capillary: 81 mg/dL (ref 70–99)
Glucose-Capillary: 89 mg/dL (ref 70–99)

## 2013-02-05 LAB — MAGNESIUM: Magnesium: 1.8 mg/dL (ref 1.5–2.5)

## 2013-02-05 MED ORDER — LORAZEPAM 2 MG/ML PO CONC: 1.0000 mg | ORAL | Status: DC | PRN

## 2013-02-05 MED ORDER — SCOPOLAMINE 1 MG/3DAYS TD PT72
1.0000 | MEDICATED_PATCH | TRANSDERMAL | Status: DC
  Administered 2013-02-05: 1.5 mg via TRANSDERMAL
  Filled 2013-02-05 (×2): qty 1

## 2013-02-05 MED ORDER — MORPHINE SULFATE (CONCENTRATE) 10 MG /0.5 ML PO SOLN: 5.0000 mg | ORAL | Status: DC | PRN

## 2013-02-05 MED ORDER — BISACODYL 10 MG RE SUPP
10.0000 mg | Freq: Every day | RECTAL | Status: DC | PRN
  Administered 2013-02-08: 10 mg via RECTAL
  Filled 2013-02-05: qty 1

## 2013-02-05 MED ORDER — STARCH (THICKENING) PO POWD
ORAL | Status: DC | PRN
Start: 2013-02-05 — End: 2013-02-05
  Filled 2013-02-05: qty 227

## 2013-02-05 MED ORDER — MAGIC MOUTHWASH W/LIDOCAINE
10.0000 mL | Freq: Four times a day (QID) | ORAL | Status: DC
  Administered 2013-02-05 – 2013-02-07 (×7): 10 mL via ORAL
  Filled 2013-02-05 (×16): qty 10

## 2013-02-05 NOTE — Progress Notes (Signed)
PT Cancellation Note  ___Treatment cancelled today due to medical issues with patient which prohibited therapy  ___ Treatment cancelled today due to patient receiving procedure or test   ___ Treatment cancelled today due to patient's refusal to participate   _X_ Treatment cancelled today due to Palliative Care Meeting.   Rica Koyanagi  PTA [facility]  Acute  Rehab Pager      [phone]

## 2013-02-05 NOTE — Progress Notes (Addendum)
Palliative Medicine Team at [facility]  Date: 02/05/2013   Patient Name: Ashlee [name]  DOB: 1914/10/01  MRN: [id number]  Age / Sex: 80 y.o., female   PCP: Jerlyn Ly, MD Referring Physician: Allie Bossier, MD  HPI/Reason for Consultation: 80 yo woman who has been living at [facility] with persistent issues with dysphagia, failure to thrive and worsening debility. She was admitted with throat pain, difficulty swallowing and found to have a food impaction with an erosive duodenal ulcer. PMT asked to consult for goals of care in the setting of what is multifactorial terminal dysphagia of very advanced age.   Participants in Discussion: Patient and her HCPOA, who is her sister in law. No other family who are local or closely connected.  Goals/Summary of Discussion: 1. DNR 2. Full Comfort  Allow for natural death to occur  Comfort feeding as tolerated-no overly restrictive diet (within reason given she had a food impaction)  "peaceful death" specific request- she tells me she has had a feeling that she was dying. She and her HCPOA had a very open discussion about her desire to focus purely on comfort.  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 She thinks current measures are worthless and not helping her and just "prolonging her suffering" in her own words-she was extremely clear and thoughtful in expressing her wishes. 3. Scope of Treatment:  No antibiotics  No anti-cogulation- would avoid even aspirin given her ulcer and risk for pill impaction  No Antihypertensives or rate controllers  No re-hospitalization  Discontinue all tele, O2 is optional for comfort  Essentially de-escalate her medical interventions and focus  purely on medications and intervention directed at comfort only-for this reason she is hospice facility appropriate- during my time with her it was evident that she was even having problems with her own saliva-I think some of that is from the throat pain.  Recommendations:   Scop patch for oral secretions-wet vocal quality  Allow for whatever food she desires and is able to swallow as a comfort measure-she is requesting mashed potatoes and gravy which is reasonable.  She is hospice facility appropriate- no meaningful PO intake in several days-I anticipate fairly rapid decline-she was able to get down magic cup with breakfast tray-she has issues with respiratory distress last night probably due to mild aspiration  Magic Mouthwash with lidocaine QID- she complains of a very sore throat-may be some mild thrush involved as well.  For pain and dyspnea, Roxanol PRN for SL absorption.   Prognosis- probably <4 weeks (at best) It is really just a matter of time until she has a clinically significant aspiration and becomes unresponsive-at which point she desires full comfort measures and to be able to have a peaceful and natural death when that occurs-she desires no life prolonging interventions- she requests comfort only.  Healthcare Power-of-Attorney: YES, document on file  Time: 70 minutes. Greater than 50%  of this time was spent counseling and coordinating care related to the above assessment and plan.  Signed by: Acquanetta Chain, DO  02/05/2013, 9:34 PM  Please contact Palliative Medicine Team phone at [phone] for questions and concerns.

## 2013-02-05 NOTE — Progress Notes (Signed)
Patient sleeping for 0400 CPT treatment

## 2013-02-05 NOTE — Progress Notes (Signed)
[facility] PROGRESS NOTE  Ashlee [name] [id number] DOB: 1914-08-04 DOA: 01/31/2013 PCP: Jerlyn Ly, MD  Assessment/Plan:  Dysphasia -Per GI Dysphagia, multifactorial (dysmotility, large hiatal hernia, stenotic UES and possible distal esophageal ring). Hopefully she can at least tolerate a dysphagia 1 diet after dilation of proximal and distal esophagus 02/02/2013  -Dr. Lane Hacker (palliative care) spent an extended amount of time today (1/1`/2015) speaking with Ashlee [name] [facility]) as well as patient concerning their wishes for patient's final days. Per Dr Hilma Favors note the following health care decisions were arrived at; 1. DNR  2. Full Comfort  Allow for natural death to occur  Comfort feeding as tolerated-no overly restrictive diet  "peaceful death" specific request- she tells me she has had a feeling that she was dying. 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 She thinks current measures are worthless and not helping her. -Will contact Dr. Hilma Favors in the a.m. to determine extent of withdrawal of care i.e. no further electrolyte replacement, hypertension management... ETC?  Hypokalemia -Resolved   HTN -Slightly above AHA guidelines but given patient's age would not attempt to add medication to lower BP  Hiatal hernia -See dysphasia  Stenotic upper esophageal sphincter -See dysphasia  Anemia -Stable would not work up or treatment at this point  Atrial fibrillation -No treatment added per patient's request -   Code Status: DO NOT RESUSCITATE Family Communication:  Family at bedside for discussion of plan of care Disposition Plan: Per palliative care plan   Consultants: Palliative care  Procedures: 02/02/2013 dilation of proximal and distal esophagus   PCXR 02/04/2013 Normal cardiac silhouette. Chronic bronchitic  markings centrally. No  effusion, infiltrate, or pneumothorax.      Antibiotics:    HPI/Subjective: 80 y.o.WF  PMHx hypertension, hyperlipidemia, hypokalemia, iron deficiency anemia, stricture and stenosis of his esophagus, while the ulcer, dysphagia, food impaction of the esophagus, started experiencing increasing difficulty swallowing for the last day or 2. Patient feels like she is having a lump in the throat with some sore throat. After reaching ER patient was taken to EGD by Dr. Deatra Ina which showed esophageal stricture with food impaction and erosive gastritis duodenal ulcer. Since patient still has difficulty swallowing Dr. Deatra Ina wanted swallow evaluation and patient has been admitted to med floor for further management. 02/04/2013 patient's maintenance chart is that she would like to drink thin liquids such as water and juice. Negative SOB, states negative CP 02/05/2013 patient states doing well, negative CP, negative SOB,. Sitting comfortably in bed discussing healthcare wishes with Dr. Hilma Favors and Ashlee [name] [facility])     Objective: Filed Vitals:   02/04/13 1400 02/04/13 2210 02/05/13 0550 02/05/13 1500  BP: 136/45 135/80 152/72 136/63  Pulse: 87 90 88 93  Temp: 98.8 F (37.1 C) 97.5 F (36.4 C) 98.1 F (36.7 C) 97.5 F (36.4 C)  TempSrc: Oral Oral Oral Oral  Resp: 18 14 18 16   Height:      Weight:      SpO2: 99% 99% 100% 100%    Intake/Output Summary (Last 24 hours) at 02/05/13 1840 Last data filed at 02/05/13 1800  Gross per 24 hour  Intake    300 ml  Output      2 ml  Net    298 ml   Filed Weights   01/31/13 1526  Weight: 53.071 kg (117 lb)  Exam:   General:  A./O. x4, NAD  Cardiovascular: Irregular irregular rhythm and rate, negative murmurs rubs or gallops, DP/PT pulse one plus bilateral  Respiratory: Diffuse coarse breath sounds; improved from 12/31  Abdomen: Soft, nontender, nondistended plus bowel sound  Musculoskeletal: Negative pedal  edema   Data Reviewed: Basic Metabolic Panel:  Recent Labs Lab 02/01/13 0800 02/02/13 1159 02/03/13 0545 02/04/13 1308 02/04/13 1825 02/04/13 2227 02/05/13 0214 02/05/13 0605 02/05/13 1030  NA 135 137 139 142  --   --   --   --  141  K 2.7* 3.1* 3.2* 2.9* 3.6* 3.7 3.4* 3.8 3.7  3.7  CL 99 101 108 100  --   --   --   --  102  CO2 26 18* 21 27  --   --   --   --  25  GLUCOSE 109* 133* 130* 124*  --   --   --   --  113*  BUN 12 14 16 19   --   --   --   --  30*  CREATININE 0.57 0.62 0.63 0.84  --   --   --   --  0.77  CALCIUM 8.5 8.7 7.8* 8.8  --   --   --   --  9.1  MG  --   --   --  1.6  --   --   --   --  1.8   Liver Function Tests:  Recent Labs Lab 02/01/13 0800 02/05/13 1030  AST 19 19  ALT 11 16  ALKPHOS 59 54  BILITOT 0.6 0.3  PROT 6.5 7.0  ALBUMIN 3.3* 3.3*   No results found for this basename: LIPASE, AMYLASE,  in the last 168 hours No results found for this basename: AMMONIA,  in the last 168 hours CBC:  Recent Labs Lab 02/01/13 0800 02/02/13 1159 02/03/13 0545 02/04/13 1308 02/05/13 1030  WBC 10.3 12.5* 8.9 12.8* 7.1  NEUTROABS  --   --   --   --  4.5  HGB 10.3* 11.3* 9.3* 11.7* 10.6*  HCT 31.5* 34.0* 27.9* 35.4* 32.9*  MCV 80.8 79.8 79.9 80.8 82.0  PLT 220 234 207 268 273   Cardiac Enzymes: No results found for this basename: CKTOTAL, CKMB, CKMBINDEX, TROPONINI,  in the last 168 hours BNP (last 3 results) No results found for this basename: PROBNP,  in the last 8760 hours CBG:  Recent Labs Lab 02/03/13 2355 02/04/13 1218 02/04/13 1801 02/05/13 0026 02/05/13 0707  GLUCAP 99 115* 121* 89 81    Recent Results (from the past 240 hour(s))  CULTURE, BLOOD (ROUTINE X 2)     Status: None   Collection Time    01/31/13  9:48 AM      Result Value Range Status   Specimen Description BLOOD RIGHT ARM  5 ML IN [facility] BOTTLE   Final   Special Requests NONE   Final   Culture  Setup Time     Final   Value: 01/31/2013 15:31     Performed at  [facility]   Culture     Final   Value:        BLOOD CULTURE RECEIVED NO GROWTH TO DATE CULTURE WILL BE HELD FOR 5 DAYS BEFORE ISSUING A FINAL NEGATIVE REPORT     Performed at [facility]   Report Status PENDING   Incomplete  CULTURE, BLOOD (ROUTINE X 2)     Status: None   Collection  Time    01/31/13  9:52 AM      Result Value Range Status   Specimen Description BLOOD RIGHT FOREARM  5 ML IN [facility] BOTTLE   Final   Special Requests NONE   Final   Culture  Setup Time     Final   Value: 01/31/2013 15:30     Performed at [facility]   Culture     Final   Value:        BLOOD CULTURE RECEIVED NO GROWTH TO DATE CULTURE WILL BE HELD FOR 5 DAYS BEFORE ISSUING A FINAL NEGATIVE REPORT     Performed at [facility]   Report Status PENDING   Incomplete  RAPID STREP SCREEN     Status: None   Collection Time    01/31/13  8:57 PM      Result Value Range Status   Streptococcus, Group A Screen (Direct) NEGATIVE  NEGATIVE Final   Comment: (NOTE)     A Rapid Antigen test may result negative if the antigen level in the     sample is below the detection level of this test. The FDA has not     cleared this test as a stand-alone test therefore the rapid antigen     negative result has reflexed to a Group A Strep culture.  CULTURE, GROUP A STREP     Status: None   Collection Time    01/31/13  8:57 PM      Result Value Range Status   Specimen Description THROAT   Final   Special Requests NONE   Final   Culture     Final   Value: No Beta Hemolytic Streptococci Isolated     Performed at [facility]   Report Status 02/03/2013 FINAL   Final  SURGICAL PCR SCREEN     Status: None   Collection Time    02/01/13 12:14 AM      Result Value Range Status   MRSA, PCR NEGATIVE  NEGATIVE Final   Staphylococcus aureus NEGATIVE  NEGATIVE Final   Comment:            The Xpert SA Assay (FDA     approved for NASAL specimens     in patients over 35 years of age),     is  one component of     a comprehensive surveillance     program.  Test performance has     been validated by [employer] for patients greater     than or equal to 54 year old.     It is not intended     to diagnose infection nor to     guide or monitor treatment.     Studies: Dg Chest Port 1 View  02/04/2013   CLINICAL DATA:  Edema, rapid respirations  EXAM: PORTABLE CHEST - 1 VIEW  COMPARISON:  Radiograph 02/03/2013  FINDINGS: Normal cardiac silhouette. Chronic bronchitic markings centrally. No effusion, infiltrate, or pneumothorax.  IMPRESSION: No acute findings. Chronic bronchitic markings centrally. No change from prior.   Electronically Signed   By: Suzy Bouchard M.D.   On: 02/04/2013 02:01    Scheduled Meds: . antiseptic oral rinse  15 mL Mouth Rinse q12n4p  . chlorhexidine  15 mL Mouth Rinse BID  . feeding supplement (RESOURCE BREEZE)  1 Container Oral BID BM  . magic mouthwash w/lidocaine  10 mL Oral QID  . pantoprazole (PROTONIX) IV  40 mg Intravenous  Q24H  . scopolamine  1 patch Transdermal Q72H   Continuous Infusions:   Principal Problem:   Dysphagia Active Problems:   HTN (hypertension)   Hyperlipidemia   Hypokalemia   Iron deficiency anemia   Food impaction of esophagus   Stricture and stenosis of esophagus   Duodenal ulcer   Acute esophagitis   Hiatal hernia    Time spent: 40 minutes   WOODS, CURTIS, J  [facility] Pager [phone]. If 7PM-7AM, please contact night-coverage at [email or web address], password [facility] 02/05/2013, 6:40 PM  LOS: 5 days

## 2013-02-05 NOTE — Progress Notes (Signed)
Patient sleeping for 0000 PT.

## 2013-02-06 DIAGNOSIS — E785 Hyperlipidemia, unspecified: Secondary | ICD-10-CM

## 2013-02-06 LAB — CULTURE, BLOOD (ROUTINE X 2)
Culture: NO GROWTH
Culture: NO GROWTH

## 2013-02-06 NOTE — Progress Notes (Signed)
SLP Cancellation Note  Patient Details Name: Ashlee [name] MRN: [id number] DOB: 1914/08/04   Cancelled treatment:       Reason Eval/Treat Not Completed:  (note pt now comfort, plans for hospice, slp to sign off, please reorder if desire)  SLP had previously educated pt's HCPOA to concern for dysphagia and mitigation strategies.     Luanna Salk, [place] [facility] SLP [phone]

## 2013-02-06 NOTE — Progress Notes (Signed)
CSW assisting with d/c planning. Met with pt / family this am. Pt / family requesting residential hospice placement. [place] is unable to assist at this time. [facility] was contacted this am and clinical info has been sent for review. CSW has contacted Hospice home to check status of referral. Info has been received and is being reviewed. Awaiting decision. Will continue to follow .  Werner Lean LCSW [phone]

## 2013-02-06 NOTE — Progress Notes (Signed)
[facility] PROGRESS NOTE  Ashlee [name] WHQ:[id number] DOB: [birth year] DOA: 01/31/2013 PCP: Ashlee Ly, MD  Assessment/Plan:  Dysphasia -Per GI Dysphagia, multifactorial (dysmotility, large hiatal hernia, stenotic UES and possible distal esophageal ring). Hopefully she can at least tolerate a dysphagia 1 diet after dilation of proximal and distal esophagus 02/02/2013  -Dr. Lane Hacker (palliative care) spent an extended amount of time today (1/1`/2015) speaking with [name] [facility]) as well as patient concerning their wishes for patient's final days. Per Dr Hilma Favors note the following health care decisions were arrived at; 1. DNR  2. Full Comfort  Allow for natural death to occur  Comfort feeding as tolerated-no overly restrictive diet  "peaceful death" specific request- she tells me she has had a feeling that she was dying. 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 She thinks current measures are worthless and not helping her. -Contacted Dr. Hilma Favors patient wishes to be made comfort care, and LCSW is attempting to find inpatient hospice -Per Werner Lean LCSW [phone] she discussed the following plan with patient and family;  Met with pt / family this am. Pt / family requesting residential hospice placement. [facility] is unable to assist at this time. [facility] was contacted this am and clinical info has been sent for review. CSW has contacted Hospice home to check status of referral. Info has been received and is being reviewed. Awaiting decision -Will discuss with patient and family D/Cing all labs   Hypokalemia -Resolved   HTN -Slightly above AHA guidelines but given patient's age would not attempt to add medication to lower BP  Hiatal hernia -See dysphasia  Stenotic upper esophageal sphincter -See  dysphasia  Anemia -Stable would not work up or treatment at this point  Atrial fibrillation -No treatment added per patient's request -   Code Status: DO NOT RESUSCITATE Family Communication:  Family at bedside for discussion of plan of care Disposition Plan: Per palliative care plan   Consultants: Palliative care  Procedures: 02/02/2013 dilation of proximal and distal esophagus   PCXR 02/04/2013 Normal cardiac silhouette. Chronic bronchitic markings centrally. No  effusion, infiltrate, or pneumothorax.      Antibiotics:    HPI/Subjective: 80 y.o.WF  PMHx hypertension, hyperlipidemia, hypokalemia, iron deficiency anemia, stricture and stenosis of his esophagus, while the ulcer, dysphagia, food impaction of the esophagus, started experiencing increasing difficulty swallowing for the last day or 2. Patient feels like she is having a lump in the throat with some sore throat. After reaching ER patient was taken to EGD by Dr. Deatra Ina which showed esophageal stricture with food impaction and erosive gastritis duodenal ulcer. Since patient still has difficulty swallowing Dr. Deatra Ina wanted swallow evaluation and patient has been admitted to med floor for further management. 02/04/2013 patient's maintenance chart is that she would like to drink thin liquids such as water and juice. Negative SOB, states negative CP 02/05/2013 patient states doing well, negative CP, negative SOB,. Sitting comfortably in bed discussing healthcare wishes with Dr. Hilma Favors and [name] [facility]) 02/06/2013 patient and family have decided on inpatient hospice     Objective: Filed Vitals:   02/05/13 1500 02/05/13 2133 02/06/13 0524 02/06/13 1419  BP: 136/63 136/75 121/75 128/70  Pulse: 93 96 70 78  Temp: 97.5 F (36.4 C) 97.9 F (36.6 C) 97.5 F (36.4 C) 98 F (36.7  C)  TempSrc: Oral Oral Oral Oral  Resp: 16 15 16 16   Height:      Weight:      SpO2: 100% 100% 100% 100%    Intake/Output  Summary (Last 24 hours) at 02/06/13 2144 Last data filed at 02/06/13 1800  Gross per 24 hour  Intake    240 ml  Output      4 ml  Net    236 ml   Filed Weights   01/31/13 1526  Weight: 53.071 kg (117 lb)    Exam:   General:  A./O. x4, NAD  Cardiovascular: Irregular irregular rhythm and rate, negative murmurs rubs or gallops, DP/PT pulse one plus bilateral  Respiratory: Diffuse coarse breath sounds; improved from 1/1  Abdomen: Soft, nontender, nondistended plus bowel sound  Musculoskeletal: Negative pedal edema   Data Reviewed: Basic Metabolic Panel:  Recent Labs Lab 02/01/13 0800 02/02/13 1159 02/03/13 0545 02/04/13 1308 02/04/13 1825 02/04/13 2227 02/05/13 0214 02/05/13 0605 02/05/13 1030  NA 135 137 139 142  --   --   --   --  141  K 2.7* 3.1* 3.2* 2.9* 3.6* 3.7 3.4* 3.8 3.7  3.7  CL 99 101 108 100  --   --   --   --  102  CO2 26 18* 21 27  --   --   --   --  25  GLUCOSE 109* 133* 130* 124*  --   --   --   --  113*  BUN 12 14 16 19   --   --   --   --  30*  CREATININE 0.57 0.62 0.63 0.84  --   --   --   --  0.77  CALCIUM 8.5 8.7 7.8* 8.8  --   --   --   --  9.1  MG  --   --   --  1.6  --   --   --   --  1.8   Liver Function Tests:  Recent Labs Lab 02/01/13 0800 02/05/13 1030  AST 19 19  ALT 11 16  ALKPHOS 59 54  BILITOT 0.6 0.3  PROT 6.5 7.0  ALBUMIN 3.3* 3.3*   No results found for this basename: LIPASE, AMYLASE,  in the last 168 hours No results found for this basename: AMMONIA,  in the last 168 hours CBC:  Recent Labs Lab 02/01/13 0800 02/02/13 1159 02/03/13 0545 02/04/13 1308 02/05/13 1030  WBC 10.3 12.5* 8.9 12.8* 7.1  NEUTROABS  --   --   --   --  4.5  HGB 10.3* 11.3* 9.3* 11.7* 10.6*  HCT 31.5* 34.0* 27.9* 35.4* 32.9*  MCV 80.8 79.8 79.9 80.8 82.0  PLT 220 234 207 268 273   Cardiac Enzymes: No results found for this basename: CKTOTAL, CKMB, CKMBINDEX, TROPONINI,  in the last 168 hours BNP (last 3 results) No results found for  this basename: PROBNP,  in the last 8760 hours CBG:  Recent Labs Lab 02/03/13 2355 02/04/13 1218 02/04/13 1801 02/05/13 0026 02/05/13 0707  GLUCAP 99 115* 121* 89 81    Recent Results (from the past 240 hour(s))  CULTURE, BLOOD (ROUTINE X 2)     Status: None   Collection Time    01/31/13  9:48 AM      Result Value Range Status   Specimen Description BLOOD RIGHT ARM  5 ML IN [facility] BOTTLE   Final   Special Requests NONE   Final  Culture  Setup Time     Final   Value: 01/31/2013 15:31     Performed at [facility]   Culture     Final   Value: NO GROWTH 5 DAYS     Performed at [facility]   Report Status 02/06/2013 FINAL   Final  CULTURE, BLOOD (ROUTINE X 2)     Status: None   Collection Time    01/31/13  9:52 AM      Result Value Range Status   Specimen Description BLOOD RIGHT FOREARM  5 ML IN [facility] BOTTLE   Final   Special Requests NONE   Final   Culture  Setup Time     Final   Value: 01/31/2013 15:30     Performed at [facility]   Culture     Final   Value: NO GROWTH 5 DAYS     Performed at [facility]   Report Status 02/06/2013 FINAL   Final  RAPID STREP SCREEN     Status: None   Collection Time    01/31/13  8:57 PM      Result Value Range Status   Streptococcus, Group A Screen (Direct) NEGATIVE  NEGATIVE Final   Comment: (NOTE)     A Rapid Antigen test may result negative if the antigen level in the     sample is below the detection level of this test. The FDA has not     cleared this test as a stand-alone test therefore the rapid antigen     negative result has reflexed to a Group A Strep culture.  CULTURE, GROUP A STREP     Status: None   Collection Time    01/31/13  8:57 PM      Result Value Range Status   Specimen Description THROAT   Final   Special Requests NONE   Final   Culture     Final   Value: No Beta Hemolytic Streptococci Isolated     Performed at [facility]   Report Status 02/03/2013 FINAL   Final   SURGICAL PCR SCREEN     Status: None   Collection Time    02/01/13 12:14 AM      Result Value Range Status   MRSA, PCR NEGATIVE  NEGATIVE Final   Staphylococcus aureus NEGATIVE  NEGATIVE Final   Comment:            The Xpert SA Assay (FDA     approved for NASAL specimens     in patients over 5 years of age),     is one component of     a comprehensive surveillance     program.  Test performance has     been validated by [employer] for patients greater     than or equal to 22 year old.     It is not intended     to diagnose infection nor to     guide or monitor treatment.     Studies: No results found.  Scheduled Meds: . antiseptic oral rinse  15 mL Mouth Rinse q12n4p  . chlorhexidine  15 mL Mouth Rinse BID  . feeding supplement (RESOURCE BREEZE)  1 Container Oral BID BM  . magic mouthwash w/lidocaine  10 mL Oral QID  . pantoprazole (PROTONIX) IV  40 mg Intravenous Q24H  . scopolamine  1 patch Transdermal Q72H   Continuous Infusions:   Principal Problem:   Dysphagia  Active Problems:   HTN (hypertension)   Hyperlipidemia   Hypokalemia   Iron deficiency anemia   Food impaction of esophagus   Stricture and stenosis of esophagus   Duodenal ulcer   Acute esophagitis   Hiatal hernia   Atrial fibrillation    Time spent: 35 minutes   Trey Gulbranson, J  [facility] Pager [phone]. If 7PM-7AM, please contact night-coverage at [email or web address], password [facility] 02/06/2013, 9:44 PM  LOS: 6 days

## 2013-02-06 NOTE — Progress Notes (Signed)
NUTRITION FOLLOW UP  Intervention:   - Nutrition signing off as plan is for comfort care/feeds, please consult for any nutrition needs  Nutrition Dx:   Inadequate oral intake related to clear liquid diet as evidenced by diet order - improving, diet advanced to dysphagia 2/thin and pt eating well   Goal:   Advance diet as tolerated to diet per SLP/MD - met    Assessment:   12/29 - Pt from [facility] assisted living, admitted with 4 days of sore throat, shortness of breath, vomiting, and poor appetite. Pt reported on admission inability to swallow fluid and pills x 24 hours. Family reported pt with more difficulty swallowing bread and food over the past several weeks. Pt found to have food impaction of esophagus. Had EGD 12/27. Followed by SLP, had MBS today - noted pt with severe, suspected primary esophageal dysphagia with recommendations for dysphagia 1 (puree) diet with nectar thick liquids. Pt alone in room during visit. Noted pt combative earlier this morning. Past weight trend indicates pt's weight has been relatively stable the past several months.   1/2 - Noted plans from palliative care meeting are for comfort care with comfort feeds. Met with pt and healthcare POA who report pt ate 1/3 of mashed potatoes/gravy yesterday and had some eggs for breakfast this morning. Pt appears comfortable.   Height: Ht Readings from Last 1 Encounters:  01/31/13 4' 10"  (1.473 m)    Weight Status:   Wt Readings from Last 1 Encounters:  01/31/13 117 lb (53.071 kg)    Re-estimated needs:  Kcal: 1325-1525  Protein: 65-80g  Fluid: 1.3-1.5L/day   Skin: Intact    Diet Order: Dysphagia 2, thin   Intake/Output Summary (Last 24 hours) at 02/06/13 1342 Last data filed at 02/06/13 1000  Gross per 24 hour  Intake    360 ml  Output      4 ml  Net    356 ml    Last BM: 12/29   Labs:   Recent Labs Lab 02/03/13 0545 02/04/13 1308  02/05/13 0214 02/05/13 0605 02/05/13 1030  NA 139  142  --   --   --  141  K 3.2* 2.9*  < > 3.4* 3.8 3.7  3.7  CL 108 100  --   --   --  102  CO2 21 27  --   --   --  25  BUN 16 19  --   --   --  30*  CREATININE 0.63 0.84  --   --   --  0.77  CALCIUM 7.8* 8.8  --   --   --  9.1  MG  --  1.6  --   --   --  1.8  GLUCOSE 130* 124*  --   --   --  113*  < > = values in this interval not displayed.  CBG (last 3)   Recent Labs  02/04/13 1801 02/05/13 0026 02/05/13 0707  GLUCAP 121* 89 81    Scheduled Meds: . antiseptic oral rinse  15 mL Mouth Rinse q12n4p  . chlorhexidine  15 mL Mouth Rinse BID  . feeding supplement (RESOURCE BREEZE)  1 Container Oral BID BM  . magic mouthwash w/lidocaine  10 mL Oral QID  . pantoprazole (PROTONIX) IV  40 mg Intravenous Q24H  . scopolamine  1 patch Transdermal Q72H     Mikey College MS, RD, [place] Pager [phone] After Hours Pager

## 2013-02-07 DIAGNOSIS — R410 Disorientation, unspecified: Secondary | ICD-10-CM | POA: Diagnosis not present

## 2013-02-07 NOTE — Progress Notes (Signed)
Per [facility], liaison to meet with family today to discuss admission.  Bernita Raisin, [place] Work [phone]

## 2013-02-07 NOTE — Progress Notes (Signed)
Per [facility], they are still reviewing consults and will contact CSW with a disposition today.  Bernita Raisin, [place] Work [phone]

## 2013-02-07 NOTE — Progress Notes (Signed)
Per Beverlee Nims at [facility], facility will accept Pt, as Pt has declined since her assessment.  Beverlee Nims stated that it's likely that the facility will have a bed available tomorrow.  CSW to follow.  Bernita Raisin, [place] Work [phone]

## 2013-02-07 NOTE — Progress Notes (Signed)
Confirmed with Beverlee Nims that Pt will be admitted tomorrow.    Beverlee Nims asking that Pt be at the facility around 10.  MD notified.  Bernita Raisin, [place] Work [phone]

## 2013-02-07 NOTE — Progress Notes (Addendum)
Spoke with Pt's daughter-in-law, Bethena Roys.  Bethena Roys stated that [facility] will not accept Pt back if she cannot walk.    Bethena Roys asked that [place] make a referral to [facility] and look into [facility] (with the hopes for [place]) as a backup.  CSW thanked [place] for her time.  MD notified.  Referral made to [facility].  Bernita Raisin, [place] Work [phone]

## 2013-02-07 NOTE — Progress Notes (Addendum)
Physical Therapy Discharge Patient Details Name: Ashlee [name] MRN: [id number] DOB: [birth year] Today's Date: 02/07/2013 Time:  -     Patient discharged from PT services secondary to medical decline, Palliative Consult--goals of care are for comfort only.  PT will sign off at this time given this development; Pt seen by PTA last visit and discussed with RN. See  Last note for any functional mobility details needed  Please see latest therapy progress note for current level of functioning and progress;  Since this note, there was incomplete note from [facility] observed from this pm; Goals may be adjusted and if PT needed, please re-consult; Thank you;  GP     [facility] 02/07/2013, 4:50 PM

## 2013-02-07 NOTE — Discharge Summary (Addendum)
Physician Discharge Summary  Ashlee [name] UTM:[id number] DOB: 1914/03/08 DOA: 01/31/2013  PCP: Jerlyn Ly, MD  Admit date: 01/31/2013 Discharge date: 02/07/2013  Time spent: 35 minutes  Recommendations for Outpatient Follow-up:  Dysphasia  -Per GI Dysphagia, multifactorial (dysmotility, large hiatal hernia, stenotic UES and possible distal esophageal ring). Hopefully she can at least tolerate a dysphagia 1 diet after dilation of proximal and distal esophagus 02/02/2013  -Dr. Lane Hacker (palliative care) spent an extended amount of time today (1/1`/2015) speaking with [name] [facility]) as well as patient concerning their wishes for patient's final days. Per Dr Hilma Favors note the following health care decisions were arrived at;  1. DNR  2. Full Comfort  Allow for natural death to occur  Comfort feeding as tolerated-no overly restrictive diet  "peaceful death" specific request- she tells me she has had a feeling that she was dying. 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 She thinks current measures are worthless and not helping her.  -Contacted Dr. Hilma Favors patient wishes to be made comfort care, and LCSW is attempting to find inpatient hospice  -Per Werner Lean LCSW [phone] she discussed the following plan with patient and family;  Met with pt / family this am. Pt / family requesting residential hospice placement. [place] is unable to assist at this time. Patient will be discharged to [place] [facility] on 02/08/2013  -DC'd all labs, Comfort Care   Hypokalemia  -Comfort care  HTN  -No longer monitoring full comfort care   Hiatal hernia  -Comfort care  Stenotic upper esophageal sphincter  -Comfort care  Anemia  -Comfort care  Atrial fibrillation  -No treatment added per patient's request; comfort care    Discharge  Diagnoses:  Principal Problem:   Dysphagia Active Problems:   HTN (hypertension)   Hyperlipidemia   Hypokalemia   Iron deficiency anemia   Food impaction of esophagus   Stricture and stenosis of esophagus   Duodenal ulcer   Acute esophagitis   Hiatal hernia   Atrial fibrillation   Acute delirium   Discharge Condition: Stable  Diet recommendation: General Filed Weights   01/31/13 1526  Weight: 53.071 kg (117 lb)    History of present illness:  80 y.o.WF PMHx hypertension, hyperlipidemia, hypokalemia, iron deficiency anemia, stricture and stenosis of his esophagus, while the ulcer, dysphagia, food impaction of the esophagus, started experiencing increasing difficulty swallowing for the last day or 2. Patient feels like she is having a lump in the throat with some sore throat. After reaching ER patient was taken to EGD by Dr. Deatra Ina which showed esophageal stricture with food impaction and erosive gastritis duodenal ulcer. Since patient still has difficulty swallowing Dr. Deatra Ina wanted swallow evaluation and patient has been admitted to med floor for further management. 02/04/2013 patient's maintenance chart is that she would like to drink thin liquids such as water and juice. Negative SOB, states negative CP 02/05/2013 patient states doing well, negative CP, negative SOB,. Sitting comfortably in bed discussing healthcare wishes with Dr. Hilma Favors and [name] [facility]) 02/06/2013 patient and family have decided on inpatient hospice 02/07/2013 patient in good spirits awaiting discharge to inpatient hospice   Consultants:  Palliative care    Procedures:  02/02/2013  dilation of proximal and distal esophagus   PCXR 02/04/2013  Normal cardiac silhouette. Chronic bronchitic markings centrally. No  effusion, infiltrate, or pneumothorax.     Discharge Exam: Filed Vitals:   02/06/13 1419 02/06/13 2200 02/07/13 0600 02/07/13 1400  BP: 128/70 142/86 159/71 130/67  Pulse: 78 62 80 50   Temp: 98 F (36.7 C) 98 F (36.7 C) 97.6 F (36.4 C) 98 F (36.7 C)  TempSrc: Oral Oral Oral Axillary  Resp: _0 Height:      Weight:      SpO2: 100% 100% 100% 100%    General: A./O. x4, NAD Cardiovascular: Irregular irregular rhythm and rate, negative murmurs rubs or gallops  Respiratory: Clear to auscultation bilateral      Discharge Instructions     Medication List         albuterol (2.5 MG/3ML) 0.083% nebulizer solution  Commonly known as:  PROVENTIL  Take 2.5 mg by nebulization every 4 (four) hours as needed for wheezing or shortness of breath.     alendronate 70 MG tablet  Commonly known as:  FOSAMAX  Take 70 mg by mouth every 7 (seven) days. Take with a full glass of water on an empty stomach every Saturday     amLODipine 5 MG tablet  Commonly known as:  NORVASC  Take 5 mg by mouth every morning.     aspirin 81 MG chewable tablet  Chew 81 mg by mouth every morning.     cholecalciferol 400 UNITS Tabs tablet  Commonly known as:  VITAMIN D  Take 400 Units by mouth daily.     ELLIS TONIC PO  Take 15 mLs by mouth 2 (two) times daily.     esomeprazole 40 MG capsule  Commonly known as:  NEXIUM  Take 1 capsule (40 mg total) by mouth daily at 12 noon.     ferrous sulfate 325 (65 FE) MG tablet  Take 325 mg by mouth daily with breakfast.     HYDROcodone-acetaminophen 5-325 MG per tablet  Commonly known as:  NORCO/VICODIN  Take 1 tablet by mouth 3 (three) times daily. 0900, 1300, 2000     polyethylene glycol powder powder  Commonly known as:  GLYCOLAX/MIRALAX  Take 1 Container by mouth daily as needed for mild constipation.     PROCEL Powd  Take 1 packet by mouth 2 (two) times daily.     silver sulfADIAZINE 1 % cream  Commonly known as:  SILVADENE  Apply 1 application topically daily.       Allergies  Allergen Reactions  . Penicillins Hives    Patient stated "big red itchy rash all over my body"      The results of significant  diagnostics from this hospitalization (including imaging, microbiology, ancillary and laboratory) are listed below for reference.    Significant Diagnostic Studies: Dg Chest 2 View  01/31/2013   CLINICAL DATA:  Shortness of breath, nasal congestion, sore throat.  EXAM: CHEST  2 VIEW  COMPARISON:  10/05/2012  FINDINGS: The cardiac silhouette is within normal limits. There remain coarse interstitial markings diffusely throughout both lungs likely reflective of underlying chronic lung disease. Curvilinear density in the left lung base is unchanged from multiple prior studies. There is no evidence of new airspace consolidation, edema, pleural effusion, or pneumothorax. Mild biapical pleural thickening is noted. Extensive atherosclerotic vascular calcification is noted. No acute osseous abnormality is identified.  IMPRESSION: Stable appearance of the chest. No evidence of acute airspace disease.   Electronically Signed   By: Logan Bores   On: 01/31/2013 09:35   Dg Chest [facility]  1 View  02/04/2013   CLINICAL DATA:  Edema, rapid respirations  EXAM: PORTABLE CHEST - 1 VIEW  COMPARISON:  Radiograph 02/03/2013  FINDINGS: Normal cardiac silhouette. Chronic bronchitic markings centrally. No effusion, infiltrate, or pneumothorax.  IMPRESSION: No acute findings. Chronic bronchitic markings centrally. No change from prior.   Electronically Signed   By: Suzy Bouchard M.D.   On: 02/04/2013 02:01   Dg Chest Port 1 View  02/03/2013   CLINICAL DATA:  CHF  EXAM: PORTABLE CHEST - 1 VIEW  COMPARISON:  01/31/2013, 10/05/2012  FINDINGS: There is bilateral interstitial thickening most consistent with chronic bronchitic changes. There is no new focal parenchymal opacity, pleural effusion or pneumothorax. Stable cardiomediastinal silhouette. Dense mitral annular calcification noted. Unremarkable osseous structures.  IMPRESSION: No acute cardiopulmonary process.  Chronic bronchitic changes.   Electronically Signed   By: Kathreen Devoid   On: 02/03/2013 13:11   Dg Swallowing Func-speech Pathology  02/02/2013   Earle Gell McCoy, CCC-SLP     02/02/2013 10:02 AM Objective Swallowing Evaluation: Modified Barium Swallowing Study   Patient Details  Name: MONICE LUNDY MRN: [id number] Date of Birth: 08-02-1914  Today's Date: 02/02/2013 Time: 7628-3151 SLP Time Calculation (min): 37 min  Past Medical History:  Past Medical History  Diagnosis Date  . Hypertension   . High cholesterol   . Spinal stenosis   . Osteoporosis   . Neuropathy   . Squamous cell skin cancer   . Overactive bladder   . Pelvic fracture    Past Surgical History:  Past Surgical History  Procedure Laterality Date  . Tonsillectomy      20's  . Esophagogastroduodenoscopy N/A 01/31/2013    Procedure: ESOPHAGOGASTRODUODENOSCOPY (EGD);  Surgeon: Inda Castle, MD;  Location: [facility];  Service: Endoscopy;   Laterality: N/A;   HPI:  TAMEEKA LUO is a 80 y.o. female with history of hypertension  hyperlipidemia started experiencing increasing difficulty  swallowing for the last day or 2 with c/o globus and sore throat.   After reaching ER patient was taken to EGD by Dr. Deatra Ina which  showed esophageal stricture with food impaction and erosive  gastritis duodenal ulcer. Since,  patient  has c/o continued  difficulty swallowing. Patient has been on medication for further  management. MD ordered a swallow evaluation with SLP. In the ER  patient was found to be mildly febrile but chest x-ray doesn't  show any infiltrates. On initial exam throat did not have any  features of pharyngitis.     Assessment / Plan / Recommendation Clinical Impression  Dysphagia Diagnosis: Suspected primary esophageal dysphagia Clinical impression: Patient presents with a severe, suspected  primary esophageal dysphagia, characterized by decreased UES  relaxation resulting in limited clearance of bolus into  esophagus, and severe pharyngeal residuals, which are aspirated  during and post swallow despite  what appears to be relatively  intact hyo-laryngeal elevation/excursion and  patient's  spontaneous attempts to clear pharynx with dry swallows.  SLP  provided max cueing for use of chin tuck posture which was  successful in opening UES, allowing for clearance of bolus into  the esophagus, decreased pharyngeal residuals, and protection of  the airway across consistencies when utilized correctly and with  controlled bolus sizes. AMS limiting patient's coordination of  strategy and bolus size however significantly impact aspiration  risk. Although patient senses aspiration and responds with strong  cough, she is unable to mentally expel secretions and/or utilize  strategies which would effectively clear  the airway on her own.  Aspiration of secretions likely. Given advanced age, unclear if  further w/u of possible UES dysfunction would be warranted.  Additionally, findings may be a result of known lower esophageal  deficits noted on most recent EGD.  Paged MD without response.  MD, may need to discuss with patient/family initiation of po diet  with attempt of strict use of compensatory strategies and known  risk of aspiration vs additional w/u vs temporary non-oral means  of nutrition in hopes of some recovery over time. SLP will  continue to f/u. If po diet with known risk is chosen, recommend  dysphagia 1 (puree) with nectar thick liquids via tsp, full  supervision and cueing for use of chin tuck and multiple swallows  with each bite and sip.     Treatment Recommendation  Therapy as outlined in treatment plan below    Diet Recommendation Dysphagia 1 (Puree);Nectar-thick liquid (if  chosen by family with known risk of aspiration)   Liquid Administration via: Spoon (teaspoon only) Medication Administration: Crushed with puree Supervision: Patient able to self feed;Full supervision/cueing  for compensatory strategies Compensations: Slow rate;Small sips/bites;Multiple dry swallows  after each bite/sip Postural Changes  and/or Swallow Maneuvers: Chin tuck;Seated  upright 90 degrees;Upright 30-60 min after meal    Other  Recommendations Oral Care Recommendations: Oral care BID Other Recommendations: Order thickener from pharmacy;Prohibited  food (jello, ice cream, thin soups);Remove water pitcher   Follow Up Recommendations  Skilled Nursing facility    Frequency and Duration min 3x week  2 weeks           General HPI: LAVETTE YANKOVICH is a 80 y.o. female with history of  hypertension hyperlipidemia started experiencing increasing  difficulty swallowing for the last day or 2 with c/o globus and  sore throat.  After reaching ER patient was taken to EGD by Dr.  Deatra Ina which showed esophageal stricture with food impaction and  erosive gastritis duodenal ulcer. Since,  patient  has c/o  continued difficulty swallowing. Patient has been on medication  for further management. MD ordered a swallow evaluation with SLP.  In the ER patient was found to be mildly febrile but chest x-ray  doesn't show any infiltrates. On initial exam throat did not have  any features of pharyngitis. Type of Study: Modified Barium Swallowing Study Reason for Referral: Objectively evaluate swallowing function Previous Swallow Assessment: bedside swallow evaluation complete  12/28 indicated severe dysphagia with recommendations for  objective evaluation of swallowing Diet Prior to this Study: NPO Temperature Spikes Noted: No Respiratory Status: Room air History of Recent Intubation: No Behavior/Cognition: Alert;Cooperative;Pleasant  mood;Confused;Requires cueing;Distractible;Decreased sustained  attention;Hard of hearing Oral Cavity - Dentition: Adequate natural dentition Oral Motor / Sensory Function: Within functional limits Self-Feeding Abilities: Able to feed self;Needs assist Patient Positioning: Upright in chair Baseline Vocal Quality: Wet Volitional Cough: Strong;Wet Volitional Swallow: Able to elicit (with c/o pain) Anatomy: Other (Comment) (? osteophytes  along c-spine, MD not  present to confirm ) Pharyngeal Secretions: Not observed secondary MBS (however  suspect due to wet vocal quality at baseline)    Reason for Referral Objectively evaluate swallowing function   Oral Phase Oral Preparation/Oral Phase Oral Phase: WFL   Pharyngeal Phase Pharyngeal Phase Pharyngeal Phase: Impaired Pharyngeal - Honey Pharyngeal - Honey Teaspoon: Delayed swallow initiation;Premature  spillage to valleculae;Pharyngeal residue - valleculae;Pharyngeal  residue - pyriform sinuses;Penetration/Aspiration during  swallow;Penetration/Aspiration after swallow;Significant  aspiration (Amount);Pharyngeal residue - cp segment;Pharyngeal  residue - posterior pharnyx;Compensatory strategies  attempted  (Comment) Penetration/Aspiration details (honey teaspoon): Material enters  airway, passes BELOW cords and not ejected out despite cough  attempt by patient Pharyngeal - Nectar Pharyngeal - Nectar Teaspoon: Delayed swallow  initiation;Premature spillage to valleculae;Pharyngeal residue -  valleculae;Pharyngeal residue - pyriform  sinuses;Penetration/Aspiration during  swallow;Penetration/Aspiration after swallow;Significant  aspiration (Amount);Pharyngeal residue - cp segment;Pharyngeal  residue - posterior pharnyx;Compensatory strategies attempted  (Comment) Penetration/Aspiration details (nectar teaspoon): Material enters  airway, passes BELOW cords and not ejected out despite cough  attempt by patient Pharyngeal - Nectar Straw: Delayed swallow initiation;Premature  spillage to valleculae;Pharyngeal residue - valleculae;Pharyngeal  residue - pyriform sinuses;Penetration/Aspiration during  swallow;Penetration/Aspiration after swallow;Significant  aspiration (Amount);Pharyngeal residue - cp segment;Pharyngeal  residue - posterior pharnyx;Compensatory strategies attempted  (Comment) Penetration/Aspiration details (nectar straw): Material enters  airway, passes BELOW cords and not ejected out despite  cough  attempt by patient Pharyngeal - Thin Pharyngeal - Thin Teaspoon: Delayed swallow initiation;Premature  spillage to valleculae;Pharyngeal residue - valleculae;Pharyngeal  residue - pyriform sinuses;Penetration/Aspiration during  swallow;Penetration/Aspiration after swallow;Significant  aspiration (Amount);Pharyngeal residue - cp segment;Pharyngeal  residue - posterior pharnyx;Compensatory strategies attempted  (Comment) Penetration/Aspiration details (thin teaspoon): Material enters  airway, passes BELOW cords and not ejected out despite cough  attempt by patient Pharyngeal - Thin Straw: Delayed swallow initiation;Pharyngeal  residue - valleculae;Pharyngeal residue - pyriform  sinuses;Penetration/Aspiration during  swallow;Penetration/Aspiration after swallow;Significant  aspiration (Amount);Pharyngeal residue - cp segment;Pharyngeal  residue - posterior pharnyx;Compensatory strategies attempted  (Comment);Premature spillage to pyriform sinuses Penetration/Aspiration details (thin straw): Material enters  airway, passes BELOW cords and not ejected out despite cough  attempt by patient Pharyngeal - Solids Pharyngeal - Puree: Delayed swallow initiation;Premature spillage  to valleculae;Pharyngeal residue - valleculae;Pharyngeal residue  - pyriform sinuses;Penetration/Aspiration during  swallow;Penetration/Aspiration after swallow;Pharyngeal residue -  cp segment;Pharyngeal residue - posterior pharnyx;Compensatory  strategies attempted (Comment);Moderate aspiration Penetration/Aspiration details (puree): Material enters airway,  passes BELOW cords and not ejected out despite cough attempt by  patient  Cervical Esophageal Phase    GO    Cervical Esophageal Phase Cervical Esophageal Phase: Impaired Cervical Esophageal Phase - Honey Honey Teaspoon: Reduced cricopharyngeal relaxation Cervical Esophageal Phase - Nectar Nectar Teaspoon: Reduced cricopharyngeal relaxation Nectar Straw: Reduced cricopharyngeal relaxation  Cervical Esophageal Phase - Thin Thin Teaspoon: Reduced cricopharyngeal relaxation Thin Straw: Reduced cricopharyngeal relaxation Cervical Esophageal Phase - Solids Puree: Reduced cricopharyngeal relaxation        Leah McCoy MA, CCC-SLP [phone]  McCoy Leah Meryl 02/02/2013, 9:58 AM     Microbiology: Recent Results (from the past 240 hour(s))  CULTURE, BLOOD (ROUTINE X 2)     Status: None   Collection Time    01/31/13  9:48 AM      Result Value Range Status   Specimen Description BLOOD RIGHT ARM  5 ML IN [facility] BOTTLE   Final   Special Requests NONE   Final   Culture  Setup Time     Final   Value: 01/31/2013 15:31     Performed at [facility]   Culture     Final   Value: NO GROWTH 5 DAYS     Performed at [facility]   Report Status 02/06/2013 FINAL   Final  CULTURE, BLOOD (ROUTINE X 2)     Status: None   Collection Time    01/31/13  9:52 AM      Result Value Range Status   Specimen Description BLOOD RIGHT FOREARM  5 ML IN [facility] BOTTLE   Final  Special Requests NONE   Final   Culture  Setup Time     Final   Value: 01/31/2013 15:30     Performed at [facility]   Culture     Final   Value: NO GROWTH 5 DAYS     Performed at [facility]   Report Status 02/06/2013 FINAL   Final  RAPID STREP SCREEN     Status: None   Collection Time    01/31/13  8:57 PM      Result Value Range Status   Streptococcus, Group A Screen (Direct) NEGATIVE  NEGATIVE Final   Comment: (NOTE)     A Rapid Antigen test may result negative if the antigen level in the     sample is below the detection level of this test. The FDA has not     cleared this test as a stand-alone test therefore the rapid antigen     negative result has reflexed to a Group A Strep culture.  CULTURE, GROUP A STREP     Status: None   Collection Time    01/31/13  8:57 PM      Result Value Range Status   Specimen Description THROAT   Final   Special Requests NONE   Final   Culture     Final    Value: No Beta Hemolytic Streptococci Isolated     Performed at [facility]   Report Status 02/03/2013 FINAL   Final  SURGICAL PCR SCREEN     Status: None   Collection Time    02/01/13 12:14 AM      Result Value Range Status   MRSA, PCR NEGATIVE  NEGATIVE Final   Staphylococcus aureus NEGATIVE  NEGATIVE Final   Comment:            The Xpert SA Assay (FDA     approved for NASAL specimens     in patients over 74 years of age),     is one component of     a comprehensive surveillance     program.  Test performance has     been validated by [employer] for patients greater     than or equal to 35 year old.     It is not intended     to diagnose infection nor to     guide or monitor treatment.     Labs: Basic Metabolic Panel:  Recent Labs Lab 02/01/13 0800 02/02/13 1159 02/03/13 0545 02/04/13 1308 02/04/13 1825 02/04/13 2227 02/05/13 0214 02/05/13 0605 02/05/13 1030  NA 135 137 139 142  --   --   --   --  141  K 2.7* 3.1* 3.2* 2.9* 3.6* 3.7 3.4* 3.8 3.7  3.7  CL 99 101 108 100  --   --   --   --  102  CO2 26 18* 21 27  --   --   --   --  25  GLUCOSE 109* 133* 130* 124*  --   --   --   --  113*  BUN _0 --   --   --   --  30*  CREATININE 0.57 0.62 0.63 0.84  --   --   --   --  0.77  CALCIUM 8.5 8.7 7.8* 8.8  --   --   --   --  9.1  MG  --   --   --  1.6  --   --   --   --  1.8   Liver Function Tests:  Recent Labs Lab 02/01/13 0800 02/05/13 1030  AST 19 19  ALT 11 16  ALKPHOS 59 54  BILITOT 0.6 0.3  PROT 6.5 7.0  ALBUMIN 3.3* 3.3*   No results found for this basename: LIPASE, AMYLASE,  in the last 168 hours No results found for this basename: AMMONIA,  in the last 168 hours CBC:  Recent Labs Lab 02/01/13 0800 02/02/13 1159 02/03/13 0545 02/04/13 1308 02/05/13 1030  WBC 10.3 12.5* 8.9 12.8* 7.1  NEUTROABS  --   --   --   --  4.5  HGB 10.3* 11.3* 9.3* 11.7* 10.6*  HCT 31.5* 34.0* 27.9* 35.4* 32.9*  MCV 80.8 79.8 79.9 80.8 82.0   PLT 220 234 207 268 273   Cardiac Enzymes: No results found for this basename: CKTOTAL, CKMB, CKMBINDEX, TROPONINI,  in the last 168 hours BNP: BNP (last 3 results) No results found for this basename: PROBNP,  in the last 8760 hours CBG:  Recent Labs Lab 02/03/13 2355 02/04/13 1218 02/04/13 1801 02/05/13 0026 02/05/13 0707  GLUCAP 99 115* 121* 89 81       Signed:  Dia Crawford, MD [facility] [phone] pager

## 2013-02-07 NOTE — Progress Notes (Signed)
Per [facility], Pt is not appropriate for [facility], as Pt's prognosis is 4 weeks.    [facility] does go to [facility] and would love to follow Pt there, should she return.  [facility] liaison provided Pt's family with contact info for them to call if and when Pt goes back to [employer].  MD notified.  Bernita Raisin, [place] Work [phone]

## 2013-02-07 NOTE — Progress Notes (Signed)
Gave pt mouthwash earlier and instructed her to swish it in her mouth and not to swallow it. I told her I would suction the mouthwash out of her mouth with the yankeur. She voice that she understood, and then swallowed the mouthwash anyway.  She started to cough like she had aspirated it. Stated that she did pt want to be given any more

## 2013-02-07 NOTE — Progress Notes (Signed)
Clinical Social Work Department CLINICAL SOCIAL WORK PLACEMENT NOTE 02/07/2013  Patient:  Ashlee [name], Ashlee [name]  Account Number:  [id number] Admit date:  01/31/2013  Clinical Social Worker:  Levie Heritage  Date/time:  02/07/2013 03:05 PM  Clinical Social Work is seeking post-discharge placement for this patient at the following level of care:   [place]   (*CSW will update this form in [place] as items are completed)   02/07/2013  Patient/family provided with [place] Department of Clinical Social Work's list of facilities offering this level of care within the geographic area requested by the patient (or if unable, by the patient's family).  02/07/2013  Patient/family informed of their freedom to choose among providers that offer the needed level of care, that participate in Medicare, Medicaid or managed care program needed by the patient, have an available bed and are willing to accept the patient.  02/07/2013  Patient/family informed of [facility]' ownership interest in [facility], as well as of the fact that they are under no obligation to receive care at this facility.  PASARR submitted to [employer] on existing PASARR number received from [employer] on   FL2 transmitted to all facilities in geographic area requested by pt/family on  02/07/2013 FL2 transmitted to all facilities within larger geographic area on   Patient informed that his/her managed care company has contracts with or will negotiate with  certain facilities, including the following:     Patient/family informed of bed offers received:   Patient chooses bed at  Physician recommends and patient chooses bed at    Patient to be transferred to  on   Patient to be transferred to facility by   The following physician request were entered in Epic:   Additional Comments:

## 2013-02-07 NOTE — Progress Notes (Addendum)
Palliative Medicine Team Progress Note  I re-evaluated [name] this afternoon to assist with potential discharge options other than hospice facility level care since she has reportedly been improving since my initial consultation. Her daughter-in-law is at the bedside she is distressed over decisions regarding her discharge. Focus my evaluation on [name] and I found her to be quite different than when I met her on my original consultation. Spoke with nursing who confirmed that she had difficulty with mouthwash and had a significant choking probable aspiration event just prior to my visit. She seems to be becoming much more agitated- she is reaching for objects in the air and picking at her sheets, she is much less clear in answering my questions. Auscultated increasing congestion in her upper anterior lung fields. She is also wearing nasal cannula oxygen notably at a very high flow-she did not wear oxygen prior to this admission therefore recommended it be removed. She remains very deconditioned she has not been able to work with physical therapy due to weakness and fatigue. She continues to complain of a very sore throat at the site of her food impaction in her esophagus-known ulceration.   I discussed her care with the medical director at [facility] in hopes that she could be reconsidered for placement. Will continue to follow. Delirium added to problem list.  35 minutes. Greater than 50%  of this time was spent counseling and coordinating care related to the above assessment and plan.  Ashlee Hacker, DO Palliative Medicine

## 2013-02-08 MED ORDER — SCOPOLAMINE 1 MG/3DAYS TD PT72: 1.0000 | MEDICATED_PATCH | TRANSDERMAL | Status: AC

## 2013-02-08 MED ORDER — ONDANSETRON HCL 4 MG PO TABS: 4.0000 mg | ORAL_TABLET | Freq: Four times a day (QID) | ORAL | Status: AC | PRN

## 2013-02-08 MED ORDER — PANTOPRAZOLE SODIUM 40 MG PO TBEC: 40.0000 mg | DELAYED_RELEASE_TABLET | Freq: Every day | ORAL | Status: AC

## 2013-02-08 MED ORDER — LORAZEPAM 2 MG/ML PO CONC: 1.0000 mg | ORAL | Status: AC | PRN

## 2013-02-08 MED ORDER — MAGIC MOUTHWASH W/LIDOCAINE: 10.0000 mL | Freq: Four times a day (QID) | ORAL | Status: AC

## 2013-02-08 MED ORDER — BOOST / RESOURCE BREEZE PO LIQD: 1.0000 | Freq: Two times a day (BID) | ORAL | Status: AC

## 2013-02-08 MED ORDER — MORPHINE SULFATE (CONCENTRATE) 10 MG /0.5 ML PO SOLN: 5.0000 mg | ORAL | Status: AC | PRN

## 2013-02-08 NOTE — Progress Notes (Signed)
Pt's relative and POA Bethena Roys called after pt was discharged to [facility] and said that pt's notebook with important health information and records did not make it to the hospice facility. She said last time she had seen it was with the pt in the ED. We searched pt's room and did not find it. We called security and the ED, and they hadn't seen it either.  I told Bethena Roys if we found the notebook we would call and let her know.

## 2013-02-08 NOTE — Progress Notes (Signed)
Per MD, Pt ready for d/c.  Notified RN, Pt, family and facility.  Facility requesting that d/c summary be sent with the Pt; they do not need it at the outset.  Facility ready to receive Pt.  Arranged for transportation.  Bernita Raisin, [place] Work [phone]

## 2013-02-08 NOTE — Progress Notes (Deleted)
Pt's dau

## 2013-02-09 ENCOUNTER — Encounter: Payer: Self-pay | Admitting: Gastroenterology

## 2013-02-25 ENCOUNTER — Encounter (HOSPITAL_COMMUNITY): Payer: Self-pay

## 2013-02-25 ENCOUNTER — Ambulatory Visit (HOSPITAL_COMMUNITY): Admit: 2013-02-25 | Payer: Medicare Other | Admitting: Gastroenterology

## 2013-02-25 SURGERY — ESOPHAGOGASTRODUODENOSCOPY (EGD) WITH ESOPHAGEAL DILATION
Anesthesia: Moderate Sedation

## 2014-04-28 IMAGING — CR DG CHEST 1V PORT
1 series · 1 of 1 positions shown · non-contrast
Comparison: 10/04/2012

CLINICAL DATA: Difficulty breathing

PORTABLE CHEST - 1 VIEW

[AP]
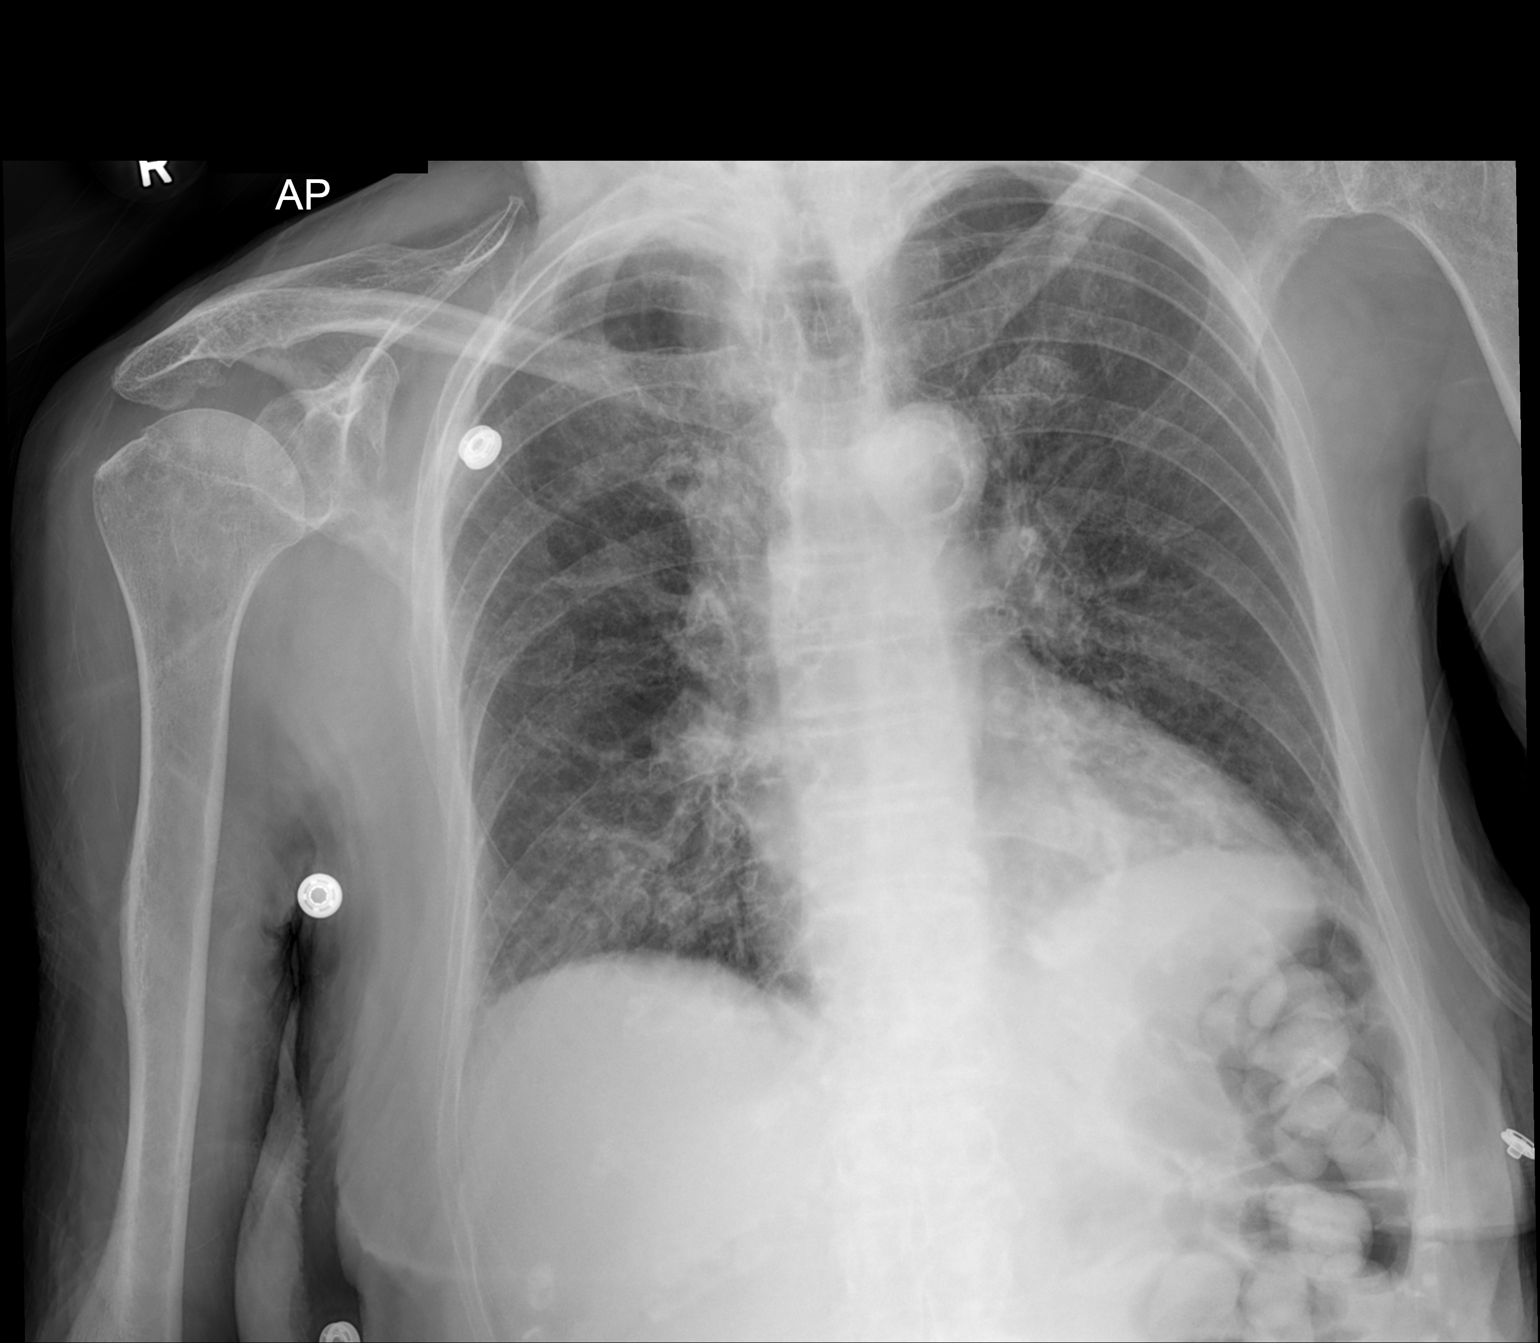

[1 of 1 positions shown; findings below may reference images not displayed]

FINDINGS: The cardiac shadow is stable.  The lungs are again well-
aerated without acute infiltrate or sizable effusion.  No gross
bony abnormality is noted.
IMPRESSION: No change from previous exam.

## 2014-08-27 IMAGING — CR DG CHEST 1V PORT
1 series · 1 of 1 positions shown · non-contrast
Comparison: 01/31/2013, 10/05/2012

CLINICAL DATA: CHF

EXAM:
PORTABLE CHEST - 1 VIEW

[AP]
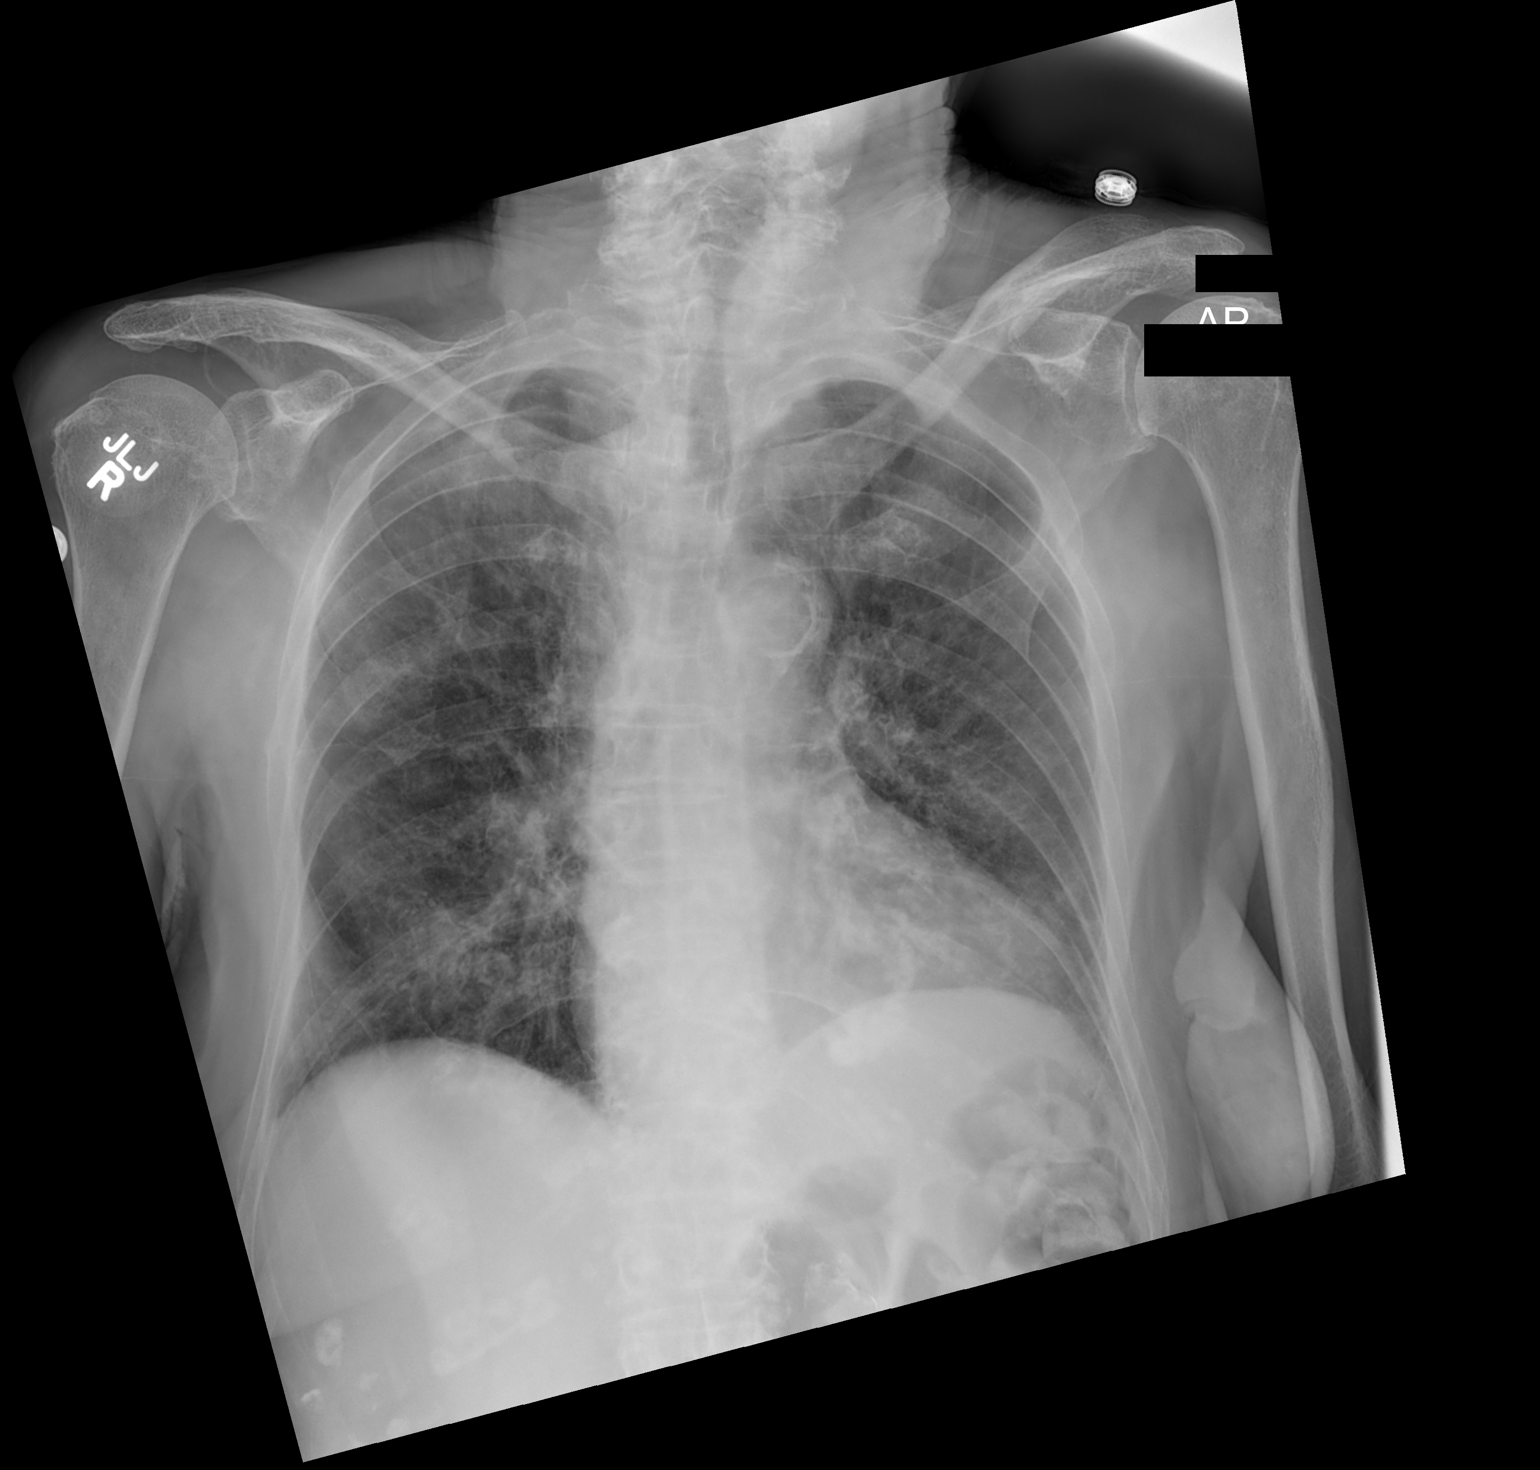

[1 of 1 positions shown; findings below may reference images not displayed]

FINDINGS: There is bilateral interstitial thickening most consistent with
chronic bronchitic changes. There is no new focal parenchymal
opacity, pleural effusion or pneumothorax. Stable cardiomediastinal
silhouette. Dense mitral annular calcification noted. Unremarkable
osseous structures.
IMPRESSION: No acute cardiopulmonary process.  Chronic bronchitic changes.

## 2014-08-28 IMAGING — CR DG CHEST 1V PORT
1 series · 1 of 1 positions shown · non-contrast
Comparison: Radiograph 02/03/2013

CLINICAL DATA: Edema, rapid respirations

EXAM:
PORTABLE CHEST - 1 VIEW

[AP]
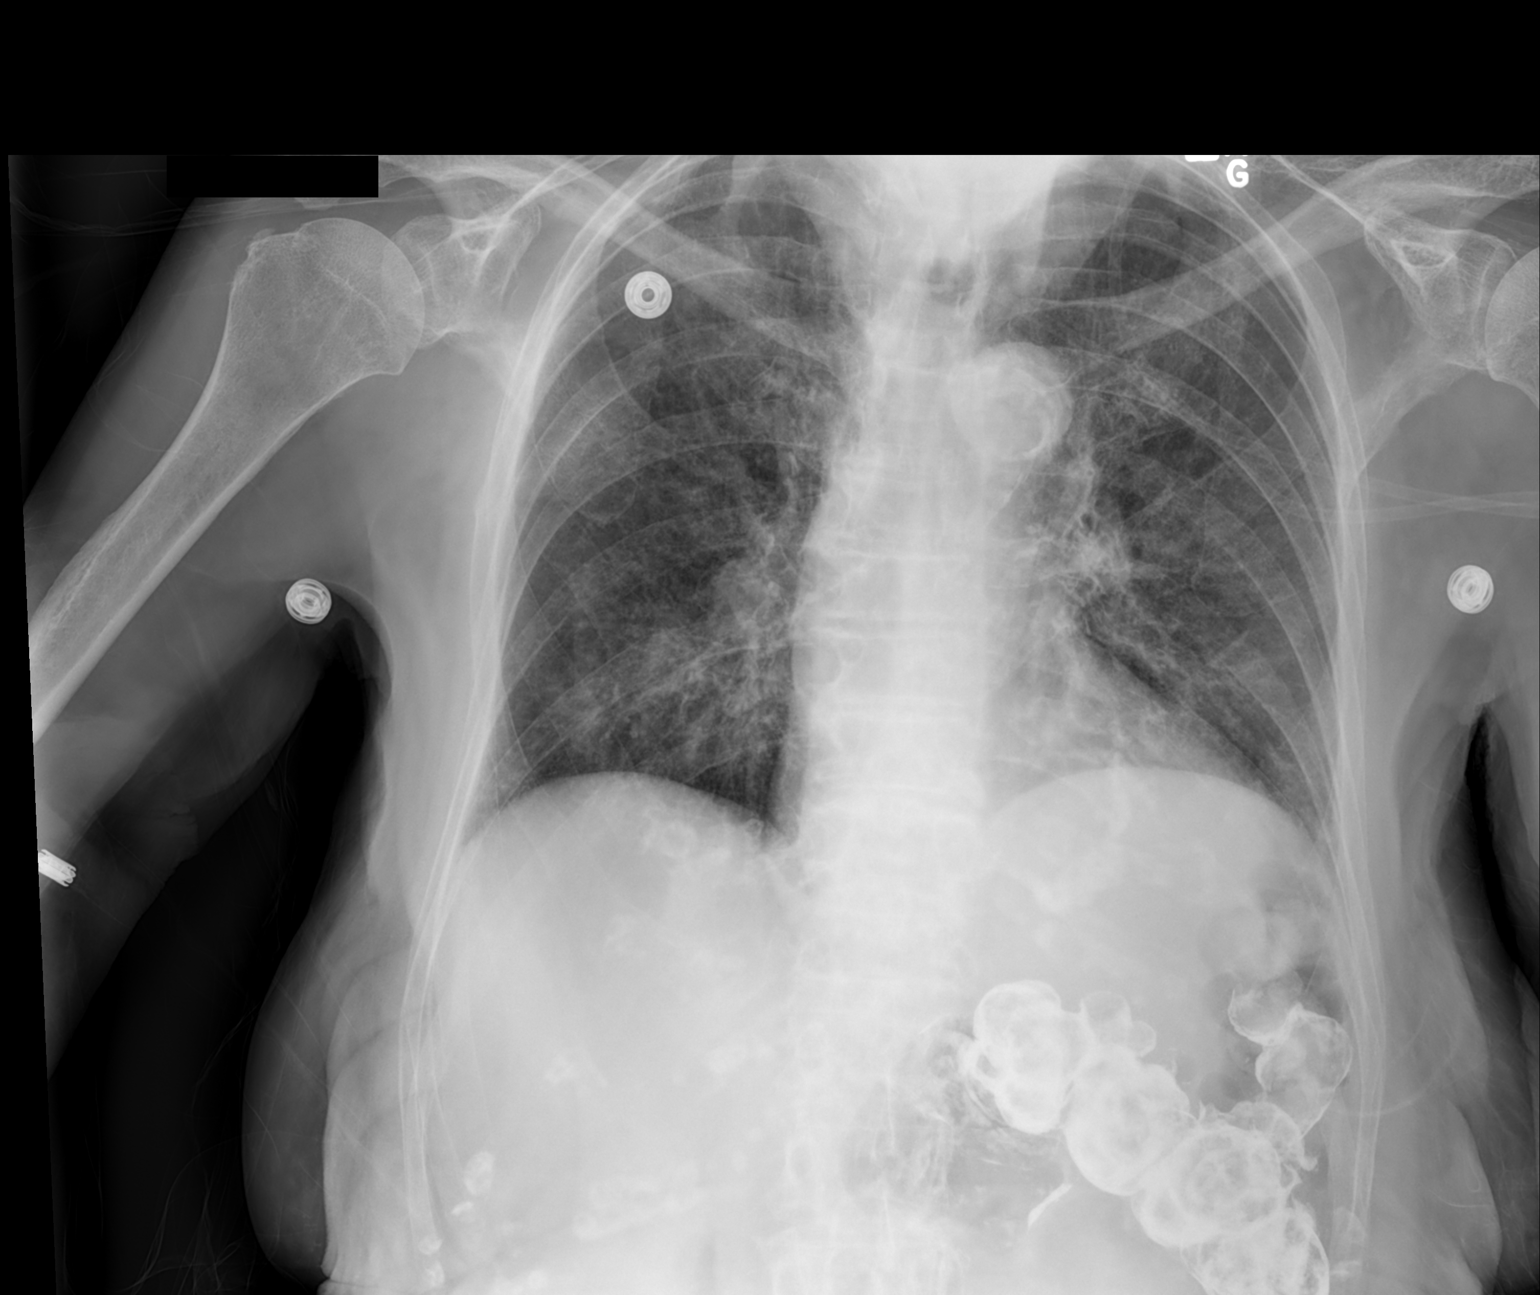

[1 of 1 positions shown; findings below may reference images not displayed]

FINDINGS: Normal cardiac silhouette. Chronic bronchitic markings centrally. No
effusion, infiltrate, or pneumothorax.
IMPRESSION: No acute findings. Chronic bronchitic markings centrally. No change
from prior.

## 2014-12-07 DEATH — deceased
